# Patient Record
Sex: Female | Born: 1970 | Race: White | Hispanic: No | Marital: Married | Smoking: Never smoker
Health system: Southern US, Community
[De-identification: ages and names within clinical notes are randomized; demographics above are authoritative.]

## PROBLEM LIST (undated history)

## (undated) DIAGNOSIS — J45909 Unspecified asthma, uncomplicated: Secondary | ICD-10-CM

## (undated) HISTORY — DX: Unspecified asthma, uncomplicated: J45.909

## (undated) HISTORY — PX: EYE SURGERY: SHX253

## (undated) HISTORY — PX: ABDOMINAL HYSTERECTOMY: SHX81

## (undated) HISTORY — PX: BREAST SURGERY: SHX581

## (undated) HISTORY — PX: KNEE SURGERY: SHX244

## (undated) HISTORY — PX: BREAST BIOPSY: SHX20

---

## 1999-01-06 ENCOUNTER — Other Ambulatory Visit: Admission: RE | Admit: 1999-01-06 | Discharge: 1999-01-06 | Payer: Self-pay | Admitting: Obstetrics and Gynecology

## 2000-02-01 ENCOUNTER — Other Ambulatory Visit: Admission: RE | Admit: 2000-02-01 | Discharge: 2000-02-01 | Payer: Self-pay | Admitting: Obstetrics and Gynecology

## 2001-08-22 ENCOUNTER — Other Ambulatory Visit: Admission: RE | Admit: 2001-08-22 | Discharge: 2001-08-22 | Payer: Self-pay | Admitting: Obstetrics and Gynecology

## 2003-03-24 ENCOUNTER — Other Ambulatory Visit: Admission: RE | Admit: 2003-03-24 | Discharge: 2003-03-24 | Payer: Self-pay | Admitting: Obstetrics and Gynecology

## 2003-05-15 ENCOUNTER — Ambulatory Visit (HOSPITAL_COMMUNITY): Admission: RE | Admit: 2003-05-15 | Discharge: 2003-05-15 | Payer: Self-pay | Admitting: Obstetrics and Gynecology

## 2003-05-15 ENCOUNTER — Encounter (INDEPENDENT_AMBULATORY_CARE_PROVIDER_SITE_OTHER): Payer: Self-pay | Admitting: Specialist

## 2004-04-20 ENCOUNTER — Other Ambulatory Visit: Admission: RE | Admit: 2004-04-20 | Discharge: 2004-04-20 | Payer: Self-pay | Admitting: Obstetrics and Gynecology

## 2004-10-22 ENCOUNTER — Other Ambulatory Visit: Admission: RE | Admit: 2004-10-22 | Discharge: 2004-10-22 | Payer: Self-pay | Admitting: Obstetrics and Gynecology

## 2005-06-03 ENCOUNTER — Other Ambulatory Visit: Admission: RE | Admit: 2005-06-03 | Discharge: 2005-06-03 | Payer: Self-pay | Admitting: Obstetrics and Gynecology

## 2005-07-08 ENCOUNTER — Encounter: Admission: RE | Admit: 2005-07-08 | Discharge: 2005-07-08 | Payer: Self-pay | Admitting: Obstetrics and Gynecology

## 2005-07-13 ENCOUNTER — Encounter (INDEPENDENT_AMBULATORY_CARE_PROVIDER_SITE_OTHER): Payer: Self-pay | Admitting: Specialist

## 2005-07-13 ENCOUNTER — Ambulatory Visit (HOSPITAL_COMMUNITY): Admission: RE | Admit: 2005-07-13 | Discharge: 2005-07-14 | Payer: Self-pay | Admitting: Obstetrics and Gynecology

## 2005-08-05 ENCOUNTER — Encounter (INDEPENDENT_AMBULATORY_CARE_PROVIDER_SITE_OTHER): Payer: Self-pay | Admitting: Specialist

## 2005-08-05 ENCOUNTER — Encounter: Admission: RE | Admit: 2005-08-05 | Discharge: 2005-08-05 | Payer: Self-pay | Admitting: Obstetrics and Gynecology

## 2006-01-27 ENCOUNTER — Encounter: Admission: RE | Admit: 2006-01-27 | Discharge: 2006-01-27 | Payer: Self-pay | Admitting: Obstetrics and Gynecology

## 2006-10-01 ENCOUNTER — Encounter: Admission: RE | Admit: 2006-10-01 | Discharge: 2006-10-01 | Payer: Self-pay | Admitting: Sports Medicine

## 2007-03-08 ENCOUNTER — Ambulatory Visit (HOSPITAL_BASED_OUTPATIENT_CLINIC_OR_DEPARTMENT_OTHER): Admission: RE | Admit: 2007-03-08 | Discharge: 2007-03-08 | Payer: Self-pay | Admitting: Orthopedic Surgery

## 2008-08-21 ENCOUNTER — Ambulatory Visit (HOSPITAL_COMMUNITY): Admission: RE | Admit: 2008-08-21 | Discharge: 2008-08-22 | Payer: Self-pay | Admitting: Obstetrics and Gynecology

## 2010-01-20 ENCOUNTER — Encounter: Admission: RE | Admit: 2010-01-20 | Discharge: 2010-01-20 | Payer: Self-pay | Admitting: Obstetrics and Gynecology

## 2010-01-28 ENCOUNTER — Encounter: Admission: RE | Admit: 2010-01-28 | Discharge: 2010-01-28 | Payer: Self-pay | Admitting: Obstetrics and Gynecology

## 2010-05-16 ENCOUNTER — Encounter: Payer: Self-pay | Admitting: Sports Medicine

## 2010-08-04 LAB — CBC
HCT: 32.2 % — ABNORMAL LOW (ref 36.0–46.0)
HCT: 42 % (ref 36.0–46.0)
Hemoglobin: 11.1 g/dL — ABNORMAL LOW (ref 12.0–15.0)
Hemoglobin: 14.6 g/dL (ref 12.0–15.0)
MCHC: 34.4 g/dL (ref 30.0–36.0)
MCHC: 34.7 g/dL (ref 30.0–36.0)
MCV: 90.7 fL (ref 78.0–100.0)
MCV: 91.8 fL (ref 78.0–100.0)
Platelets: 216 10*3/uL (ref 150–400)
Platelets: 281 10*3/uL (ref 150–400)
RBC: 3.5 MIL/uL — ABNORMAL LOW (ref 3.87–5.11)
RBC: 4.63 MIL/uL (ref 3.87–5.11)
RDW: 13.5 % (ref 11.5–15.5)
RDW: 13.5 % (ref 11.5–15.5)
WBC: 7.5 10*3/uL (ref 4.0–10.5)
WBC: 9.1 10*3/uL (ref 4.0–10.5)

## 2010-09-07 NOTE — Op Note (Signed)
[name], [name] NO.:  [id number]   MEDICAL RECORD NO.:  [id number]          PATIENT TYPE:  AMB   LOCATION:  DSC                          FACILITY:  [facility]   PHYSICIAN:  Loreta Ave, M.D. DATE OF BIRTH:  Dec 02, 1970   DATE OF PROCEDURE:  03/08/2007  DATE OF DISCHARGE:                               OPERATIVE REPORT   PREOPERATIVE DIAGNOSIS:  Lateral meniscus tear left knee.   POSTOPERATIVE DIAGNOSIS:  Lateral meniscus tear left knee.   PROCEDURE:  Left knee exam under anesthesia, arthroscopy, partial  lateral meniscectomy.   SURGEON:  Loreta Ave, M.D.   ASSISTANT:  Genene Churn. Denton Meek.   ANESTHESIA:  Knee block with sedation.   SPECIMENS:  None.   CULTURES:  None.   COMPLICATIONS:  None.   DRESSING:  Soft compressive.   PROCEDURE:  The patient was brought to the operating room and placed on  operating table in the supine position.  After adequate anesthesia had  been obtained, the left knee was examined.  Full motion and good  stability.  Tourniquet and leg holder applied.  Prepped and draped in  the usual sterile fashion.  Three portals were created, one  superolateral, one each medial and lateral parapatellar.  Inflow  catheter induced.  Knee distended.  Arthroscope introduced.  Knee  inspected.  Intact articular cartilage throughout.  Good patellofemoral  tracking.  Cruciate ligament was intact.  Medial meniscus and medial  compartment normal.  Lateral meniscus radial tear, midportion extending  back into the posterior third.  __________ out to a stable rim.  About  half the posterior half removed until I got to stable tissue.  More than  half of the meniscus left all way around.  The entire knee was examined.  No other findings were appreciated.  Instruments and fluid were removed.  Portals of the knee were injected with Marcaine.  Portals were closed  with 4-0 nylon.  Sterile compressive dressing applied.  Anesthesia  reversed.   Brought to the recovery room.  Tolerated surgery well.  No  complications.      Loreta Ave, M.D.  Electronically Signed    DFM/MEDQ  D:  03/08/2007  T:  03/09/2007  Job:  [id number]

## 2010-09-07 NOTE — Op Note (Signed)
[name], [name]              ACCOUNT NO.:  [id number]   MEDICAL RECORD NO.:  [id number]          PATIENT TYPE:  AMB   LOCATION:  SDC                           FACILITY:  [facility]   PHYSICIAN:  Dineen Kid. Rana Snare, M.D.    DATE OF BIRTH:  [birth year]   DATE OF PROCEDURE:  08/21/2008  DATE OF DISCHARGE:                               OPERATIVE REPORT   PREOPERATIVE DIAGNOSES:  1. Symptomatic rectocele.  2. Pelvic pressure.  3. Vaginal vault prolapse.   POSTOPERATIVE DIAGNOSES:  1. Symptomatic rectocele.  2. Pelvic pressure.  3. Vaginal vault prolapse.   PROCEDURE:  Posterior colporrhaphy with sacrospinous ligament suspension  and perineorrhaphy.   SURGEON:  Dineen Kid. Rana Snare, MD   ASSISTANT:  Luvenia Redden, MD   ANESTHESIA:  General endotracheal.   INDICATIONS:  [name] is a 40 year old status post hysterectomy who  complains of worsening pelvic pressure mostly with standing.  She has  some difficulty with bowel movements.  She is concerned about the bulge  that comes from her rectocele to the introitus.  It has interfered with  her ability to function on a regular basis and enjoy social activities.  She does not have any symptoms of cystocele nor does she have urinary  incontinence.  She presents for definitive surgical intervention,  planned repair of the rectocele, and the vaginal prolapse of the apex of  the cuff.  The risks and benefits of the procedure were discussed at  length which include but not limited to risk of infection, bleeding,  damage to the bowel, bladder, ureters, ovaries, possibility that this  may not alleviate the pressure of the symptoms, possibility of  recurrence.  We have discussed different options including the use of  mesh.  At this time, she wished to proceed with the traditional repair  as outlined above.  All the risks and benefits were discussed.  Informed  consent was obtained.   DESCRIPTION OF PROCEDURE:  After adequate analgesia, the patient  was  placed in the dorsal lithotomy position.  She was sterilely prepped and  draped.  Bladder sterilely drained.  Two Allis clamps were placed on the  lateral sides of the perineal body at the edge of the introitus.  A  triangular flap was made across the skin on the perineal body.  The  vagina was then undermined with Metzenbaum scissors.  An incision was  made in the midline up to approximately 2-3 cm from the apex of the  vagina.  The posterior vaginal mucosa was reflected laterally reflecting  the rectal fascia off of the posterior vaginal mucosa.  After the  entirety of the rectocele had been visualized, no evidence of enterocele  had been noted.  The right sacrospinous process was identified.  The  Schutt ligature carrier was loaded with 0 PDS, suture was placed  approximately 2-3 fingerbreadths from the sacrospinous ligament towards  the sacrum through the sacrospinous ligament with good support noted.  A  Mayo needle was used to place a pulley stitch at the right apex of the  vaginal cuff using the same suture.  Posterior colporrhaphy was carried  out first with a pursestring fashion with 0 Monocryl suture reducing the  rectocele, then plicating the perirectal fascia in the midline using  figure-of-eights with 0 Monocryl suture, also passing the puborectal  fascia near the apex of the cuff to the uterosacral ligaments.  This was  performed until the good posterior wall support was noted.  The excess  vaginal mucosa was then excised.  The apex of vagina was grasped and  closure with 2-0 Monocryl suture was carried out from the apex to the  midportion of the vagina.  The sacrospinous suspension suture was then  secured with the right apex pulling snugly to the sacrospinous process.  The remainder portion of the vaginal mucosa was then closed using the 2-  0 Monocryl suture in a running locking fashion.  A suture of 0 Monocryl  suture was used to perform a perineorrhaphy plicating  the superficial  transverse perineal muscles in the midline.  The 2-0 Monocryl suture was  then used to close the subcuticular layer with good approximation and  good hemostasis noted.  Vaginal exam at this point revealed excellent  apical support at the sacrospinous process and excellent posterior  support at the rectocele.  Two fingertips of the operator were easily  inserted in the vagina.  At this point, the vaginal packing was placed  with estrogen-coated vaginal packing.  A Foley catheter was placed and  returned with clear yellow urine.  A small skin tag on the perineum was  easily removed and cauterized with Bovie cautery.  The patient was  stable on transfer to recovery room.  Sponge and instrument count was  normal x3.  Estimated blood loss was minimal.  The patient received 1 g  of cefotetan preoperatively.  The patient will be watched overnight as  an extended outpatient.      Dineen Kid Rana Snare, M.D.  Electronically Signed     DCL/MEDQ  D:  08/21/2008  T:  08/22/2008  Job:  [id number]

## 2010-09-10 NOTE — Discharge Summary (Signed)
[name], [name]              ACCOUNT NO.:  [id number]   MEDICAL RECORD NO.:  [id number]          PATIENT TYPE:  OIB   LOCATION:  9316                          FACILITY:  [facility]   PHYSICIAN:  Dineen Kid. Rana Snare, M.D.    DATE OF BIRTH:  15-Oct-1970   DATE OF ADMISSION:  08/21/2008  DATE OF DISCHARGE:  08/22/2008                               DISCHARGE SUMMARY   HISTORY OF PRESENT ILLNESS:  Ms. Inscore is a 40 year old status post  hysterectomy, worsening pelvic pressure mostly with standing, some  difficulty with bowel movements, and a bulge from her rectocele.  The  bulge is to her introitus and it is interfering with her  ability to  function on regular basis both socially and at her capacity at work. .  She does not have any significant cystocele or bladder issues and wishes  only repair of the vault prolapse and rectocele.   HOSPITAL COURSE:  The patient underwent a posterior colporrhaphy,  sacrospinous suspension of the vagina, and perineorrhaphy.  The  procedure was uncomplicated with minimal blood loss.  Her postoperative  care was unremarkable, and by postop day 1 she was ambulating without  difficulty, tolerating a regular diet.  The packing was removed.  Her  postop hemoglobin was 11.1.  She was able to urinate on her own, and  pain was well controlled.  The patient was discharged home and will  follow up in the office in 1 week.  She is sent home with a prescription  for oxycodone #30 to use as needed, told to return for increased pain,  fever, or bleeding.       Dineen Kid Rana Snare, M.D.  Electronically Signed     DCL/MEDQ  D:  09/10/2008  T:  09/10/2008  Job:  [id number]

## 2010-09-10 NOTE — H&P (Signed)
NAME:  [name], [name]                       ACCOUNT NO.:  [id number]   MEDICAL RECORD NO.:  [id number]                   PATIENT TYPE:  AMB   LOCATION:  SDC                                  FACILITY:  [facility]   PHYSICIAN:  Dineen Kid. Rana Snare, M.D.                 DATE OF BIRTH:  05-06-1970   DATE OF ADMISSION:  05/15/2003  DATE OF DISCHARGE:                                HISTORY & PHYSICAL   HISTORY OF PRESENT ILLNESS:  Ms. Toomey is a 40 year old G3, P3 with  abnormal uterine bleeding and menorrhagia.  She underwent a sonohistogram  which shows several large polyps within the uterus the largest measuring 12  mm in size and also a large cervical polyp.  She presents today for  hysteroscopy D&C and polypectomy.   PAST MEDICAL HISTORY:  Irritable bowel syndrome and a history of anemia.   PAST SURGICAL HISTORY:  Tonsillectomy and wisdom teeth extraction.  She has  had three vaginal deliveries.   MEDICATIONS:  FiberCon, vitamin E.   ALLERGIES:  She is allergic to ERYTHROMYCIN and AMOXICILLIN.   PHYSICAL EXAMINATION:  VITAL SIGNS:  Her blood pressure is 122/80.  HEART:  Regular rate and rhythm.  LUNGS:  Clear to auscultation bilaterally.  ABDOMEN:  Nondistended, nontender.  PELVIC:  Normal external genitalia, Bartholin, Skene, urethra.  Cervix does  have a large polyp coming from the endocervix, sonohistogram shows multiple  polyps measuring 12 mm in size, normal appearing ovaries.   IMPRESSION AND PLAN:  Cervical and uterine polyps, menorrhagia.  Recommend  hysteroscopy dilation and curettage for evaluation and removal of these.  Risks and benefits were discussed at length which include but not limited to  risk of infection, bleeding, damage to uterus, tubes, ovaries, bowel,  bladder, risk of recurrence or need for further surgery if these do appear  to be cancerous or precancerous.  She does give her informed consent.                                               Dineen Kid Rana Snare,  M.D.    DCL/MEDQ  D:  05/14/2003  T:  05/14/2003  Job:  [id number]

## 2010-09-10 NOTE — Op Note (Signed)
[name], [name]              ACCOUNT NO.:  [id number]   MEDICAL RECORD NO.:  [id number]          PATIENT TYPE:  OIB   LOCATION:  9312                          FACILITY:  [facility]   PHYSICIAN:  [name], M.D.    DATE OF BIRTH:  03-May-1970   DATE OF PROCEDURE:  07/14/2005  DATE OF DISCHARGE:                                 OPERATIVE REPORT   PREOPERATIVE DIAGNOSIS:  1.  Menometrorrhagia.  2.  Endometrial mass consistent with a polyp.  3.  History of recurrent polyps.  4.  History of fibroids.   POSTOPERATIVE DIAGNOSES:  1.  Menometrorrhagia.  2.  Endometrial mass consistent with a polyp.  3.  History of recurrent polyps.  4.  History of fibroids.   OPERATION/PROCEDURE:  Laparoscopic-assisted vaginal hysterectomy.   SURGEON:  [name], M.D.   ASSISTANT:  Juluis Mire, M.D.   INDICATIONS:  Mrs. [name] is a 40 year old, gravida 3, para 3, worsening  positive menorrhagia and irregular bleeding.  She had a D&C in the past for  fibroids and endometrial polyp.  She underwent a saline-infusion ultrasound  on February 20 which showed recurrence of polypoid mass.  Also intracavitary  mass suspicious for another fibroid.  At this time she desires definitive  surgical intervention and requests hysterectomy rather than hysteroscopy and  D&C.  Risks and benefits of the procedure were discussed at length and  informed consent was obtained.  See history and physical for further  details.   FINDINGS AT THE TIME OF SURGERY:  Normal-appearing liver, appendix, normal-  appearing uterus, ovaries and fallopian tubes.   DESCRIPTION OF PROCEDURE:  After adequate analgesia, the patient was placed  in the dorsal lithotomy position. She was sterilely prepped and draped and  the bladder sterilely drained.  Speculum was placed and tenaculum was placed  on the anterior lip of the cervix.  A 1 cm infraumbilical skin incision was  made.  A Veress needle was inserted.  The abdomen was  insufflated to  dullness to percussion.  An 11 mm trocar was inserted.  The laparoscope was  inserted and the above findings were noted.  A 5 mm trocar was inserted to  the left of the midline, two fingerbreadths above the pubic symphysis under  direct visualization.  A Gyrus grasping forceps was used to grasp, ligate  and dissect across the utero-ovarian ligaments bilaterally, down across the  round ligament to the inferior portion of the broad ligaments with the  ovaries, fallopian tube and round ligaments full and laterally to the uterus  with good hemostasis achieved.  A bladder flap was created raising the  ureterovesical junction, dissecting the bladder off the anterior surface of  the uterus.  The legs were repositioned, abdomen desufflated.  Weighted  speculum was placed.  A posterior colpotomy was performed.  Cervix was  circumscribed with the Bovie cautery.  LigaSure instrument was used to  ligate across the uterosacral ligaments.  Bladder pillars  bilaterally.  Inferior portion of the broad ligament dissecting with Mayo scissors.  The  uterus was then removed intact.  A suture  was of 0 Monocryl was placed on  each uterosacral ligament.  Posterior vagina was closed in the pursestring  fashion closing the peritoneum in a pursestring fashion and the posterior  vaginal mucosa was then closed with interrupted sutures of 0 Monocryl.  Anterior vaginal mucosa was closed in similar fashion using figure-of-eights  of 0 Monocryl suture in vertical fashion.  Good approximation and good  hemostasis achieved.  Foley catheter was placed in the bladder with good  return of clear yellow urine.  The legs were repositioned, abdomen  reinsufflated.  After irrigation with a  suction irrigator, peritoneal edges  were cauterized with bipolar cautery and after a copious amount of  irrigation, adequate hemostasis was assured.  The abdomen was then  desufflated. Trocars removed.  The  infraumbilical skin  incision was closed  with 0 Vicryl interrupted suture in the fascia, 3-0 Vicryl Rapide  subcuticular suture and the 5 mm site was closed with 3-0 Vicryl Rapide  subcuticular suture.  The incision was injected with 0.25% Marcaine.  A  total of 10 mL used.  The patient tolerated the procedure well, was stable  and transferred to the recovery room.  Sponge, needle and instrument counts  were normal x3.  Estimated blood loss was 250 mL.  The patient received 1 g  of Rocephin preoperatively.      Jennifer Kid Rana Padilla, M.D.  Electronically Signed     DCL/MEDQ  D:  07/13/2005  T:  07/14/2005  Job:  [id number]

## 2010-09-10 NOTE — H&P (Signed)
[name], [name] NO.:  [id number]   MEDICAL RECORD NO.:  [id number]           PATIENT TYPE:   LOCATION:                                 FACILITY:   PHYSICIAN:  Dineen Kid. Rana Snare, M.D.         DATE OF BIRTH:   DATE OF ADMISSION:  07/13/2005  DATE OF DISCHARGE:                                HISTORY & PHYSICAL   HISTORY OF PRESENT ILLNESS:  [name] is a 40 year old G3, P3 with  worsening problems with menorrhagia and irregular bleeding.  She has had a  D&C in the past for fibroids and also an endometrial polyp.  She underwent  saline infusion ultrasound on June 14, 2005 which does show recurrence  of a polypoid mass.  No intracavitary masses are seen otherwise and ovaries  are normal in appearance, but does have a history of previous fibroid which  was resected years ago.  At this time she desires more definitive surgical  intervention and because of history of recurrent polyps and history of  fibroids she desires a hysterectomy and presents for that as well.  She has  had a recent abnormal Pap smear, but it showed a low risk HPV subtype.   PAST MEDICAL HISTORY:  Significant for occasional anxiety.   MEDICATIONS:  1.  Valium as needed.  2.  Motrin as needed.  3.  She does take Vitamin B12.  4.  Multivitamin.   ALLERGIES:  1.  She has an allergy to ERYTHROMYCIN.  2.  AMOXICILLIN gives her diarrhea.   Her husband has had a vasectomy.   PHYSICAL EXAMINATION:  VITAL SIGNS:  Her blood pressure was 110/72, weight  162.  HEART:  Regular rate and rhythm.  LUNGS:  Clear to auscultation bilaterally.  ABDOMEN:  Nondistended, nontender.  PELVIC:  Uterus is anteverted, mobile, and nontender.  No adnexal masses are  palpable.  Small amount of vaginal bleeding is noted.  The cervix appears to  be normal.  Pap smear is performed.  Her hemoglobin is 13.2.   IMPRESSION/PLAN:  1.  Menometrorrhagia.  2.  Endometrial mass consistent with a polyp with a history  of recurrent      polyps.  3.  History of fibroids.  She desires definitive surgical intervention,      requests hysterectomy rather than D&C or removal of the polyp.      Discussed different options such as D&C, D&C plus endometrial ablation.      Also discussed the fact that her last Pap smear did show some atypical      glandular cells.  She did have a low risk HPV.  She was bleeding at the      time, but the Pap smear was performed and this most likely represents      endometrial glands.  She does have a low risk HPV based on recent Pap,      so we think that a likelihood of an adenocarcinoma of the cervix is also      very low based upon this.  Similar cancer of the uterus  is less likely.      I do feel like endometrial sampling, whether it be D&C or hysterectomy,      is warranted at this time.  At this time she is more inclined to lean      towards laparoscopic assisted vaginal hysterectomy.  She has had a D&C      in the past.  She has had recurrent bleeding since then and really wants      to proceed with definitive surgery, wants preservation of the ovaries as      well.  I discussed the risks and benefits and the recovery time at      length.  Risks include, but are not limited to, risk of infection,      bleeding, damage to bowel, bladder, ureters, risks associated with      anesthesia, risks associated with blood transfusion.  She does give her      informed consent and wished to proceed.  She does want to do it in a      timely fashion since her mother is having surgery in a month and a half      and she will need to be able to help her recover as well.      Dineen Kid Rana Snare, M.D.  Electronically Signed     DCL/MEDQ  D:  07/12/2005  T:  07/12/2005  Job:  [id number]

## 2010-09-10 NOTE — Discharge Summary (Signed)
[name], [name]              ACCOUNT NO.:  [id number]   MEDICAL RECORD NO.:  [id number]          PATIENT TYPE:  OIB   LOCATION:  9312                          FACILITY:  [facility]   PHYSICIAN:  Dineen Kid. Rana Snare, M.D.    DATE OF BIRTH:  [birth year]   DATE OF ADMISSION:  07/13/2005  DATE OF DISCHARGE:  07/14/2005                                 DISCHARGE SUMMARY   HISTORY OF PRESENT ILLNESS:  [name] is a 40 year old, G3, P3 with  worsening menorrhagia and irregular bleeding.  She has had a D&C in the past  for fibroids and endometrial polyps.  She underwent ultrafusion ultrasound  on February 20, showing recurrence of a polypoid mass.  She also fibroids.  At this time, she desires definitive surgical intervention requesting  hysterectomy rather than hysteroscopy.  Risks and benefits of the procedure  were discussed at length and informed consent had been obtained.  See  History and Physical for further details.   HOSPITAL COURSE:  The patient underwent a laparoscopic-assisted vaginal  hysterectomy.  Findings at the time of surgery were a normal-appearing  liver, appendix, ovaries and normal appearing uterus.  The procedure was  uncomplicated with estimated blood loss 250 mL.  The patient's hospital  course was complicated by postoperative nausea and responded by  discontinuing Dilaudid.  By postop day #1, she had a postop hemoglobin of  11.0.  She was able to tolerate a regular diet, ambulate without difficulty.  Bowel sounds were normoactive and the incisions were clean, dry and intact.  The patient was discharged to home.   FOLLOW UP:  Follow up in the office in 2-3 weeks.   DISCHARGE MEDICATIONS:  1.  Tylox #30.  2.  Motrin 600 mg, #30, return for increased pain, fever or bleeding.      Dineen Kid Rana Snare, M.D.  Electronically Signed     DCL/MEDQ  D:  09/11/2005  T:  09/12/2005  Job:  [id number]

## 2010-09-10 NOTE — Op Note (Signed)
NAME:  [name], [name]                       ACCOUNT NO.:  [id number]   MEDICAL RECORD NO.:  [id number]                   PATIENT TYPE:  AMB   LOCATION:  [facility]                                  FACILITY:  [facility]   PHYSICIAN:  Dineen Kid. Rana Snare, M.D.                 DATE OF BIRTH:  November 27, 1970   DATE OF PROCEDURE:  05/15/2003  DATE OF DISCHARGE:                                 OPERATIVE REPORT   PREOPERATIVE DIAGNOSES:  1. Menorrhagia.  2. Uterine polyps on sonohysterogram.  3. Large cervical polyp versus cervical fibroid.  4. Left labial cyst.   POSTOPERATIVE DIAGNOSES:  1. Menorrhagia.  2. Uterine polyps on sonohysterogram.  3. Large cervical polyp versus cervical fibroid.  4. Left labial cyst.   PROCEDURES:  1. Hysteroscopy, dilatation and curettage, with resection of endometrial     polyps and resection of cervical polyp versus fibroid.  2. Excision of left labial cyst.   SURGEON:  Dineen Kid. Rana Snare, M.D.   ANESTHESIA:  General by mask.   INDICATIONS:  Ms. Impson is a 40 year old G3, P3, with abnormal bleeding  and menorrhagia.  She underwent a sonohysterogram which shows several large  polyps within the uterus, the largest measuring 12 mm in size, also a very  large cervical polyp versus cervical fibroid, and she also has a left labial  cyst that she would like excised.  She presents today for surgical  evaluation and removal of these, and the risks and benefits were discussed  at length.  Informed consent was obtained.   Findings at the time of surgery were two moderately-sized endometrial polyps  from the posterior endometrium, otherwise normal-appearing endometrial  cavity and normal-appearing ostia.  A very large cervical polyp versus  fibroid approximately 2-3 cm in size, which was resected, and a left labial  inclusion cyst approximately 7-8 mm in size.   DESCRIPTION OF PROCEDURE:  After adequate analgesia, the patient was placed  in the dorsal lithotomy position.  She  was sterilely prepped and draped.  The bladder was sterilely drained.  A Graves speculum was placed.  A  tenaculum was placed on the anterior lip of the cervix.  Attempts to remove  the cervical fibroid versus polyp with polyp forceps were unsuccessful, so  the endocervix was dilated to #31 Pratt dilator.  Hysteroscope with  resectoscope was inserted and the above findings were noted.  The  resectoscope was used to remove the endometrial polyps.  This was followed  by sharp curettage, retrieving fragments of the small endometrial polyps,  and a second look with the hysteroscope in the endometrial cavity showed  normal endometrial wall, no residual polyps, and normal-appearing ostia  bilaterally.  The endocervical polyp was identified and resected with the  resectoscope and actually has more of an appearance of a cervical fibroid.  After resection, small fragments of the polyp were also similarly resected  and the cautery function of  the resectoscope was used to cauterize that area  of the cervix.  Careful examination of the cervix and endocervix revealed no  further evidence of endocervical polyps or fibroids, and the cavity was also  clear.  The sorbitol deficit at this point was 100 mL.  The patient  tolerated the procedure well, and the tenaculum was removed and was noted to  be hemostatic.  The left labial inclusion cyst was identified and grasped  with Allis clamp, was sharply excised with its cyst intact.  The remaining  portion of the incision was closed with a single horizontal mattress of 3-0  Vicryl Rapide with good approximation and good hemostasis achieved.  The  patient was transferred to the recovery room in stable condition.  The  sponge, needle, and instrument count was normal x3.  The estimated blood  loss less than 10 mL.  Sorbitol deficit 100 mL.  The patient received 400 mg  of Cipro IV preoperatively.   DISPOSITION:  The patient will be discharged home and will follow  up in the  office in two to three weeks.  Told to return for increased pain, fever, or  bleeding.  She was given 30 mg of IV Toradol in the operating room.                                               Dineen Kid Rana Snare, M.D.    DCL/MEDQ  D:  05/15/2003  T:  05/15/2003  Job:  [id number]

## 2010-12-22 ENCOUNTER — Other Ambulatory Visit: Payer: Self-pay | Admitting: Obstetrics and Gynecology

## 2010-12-22 DIAGNOSIS — Z1231 Encounter for screening mammogram for malignant neoplasm of breast: Secondary | ICD-10-CM

## 2011-01-31 ENCOUNTER — Ambulatory Visit
Admission: RE | Admit: 2011-01-31 | Discharge: 2011-01-31 | Disposition: A | Discharge status: Home / Self Care | Payer: BC Managed Care – PPO | Source: Ambulatory Visit | Attending: Obstetrics and Gynecology | Admitting: Obstetrics and Gynecology

## 2011-01-31 DIAGNOSIS — Z1231 Encounter for screening mammogram for malignant neoplasm of breast: Secondary | ICD-10-CM

## 2011-02-01 LAB — POCT HEMOGLOBIN-HEMACUE
Hemoglobin: 14.6
Operator id: 112821

## 2012-01-05 ENCOUNTER — Other Ambulatory Visit: Payer: Self-pay | Admitting: Obstetrics and Gynecology

## 2012-01-05 DIAGNOSIS — Z1231 Encounter for screening mammogram for malignant neoplasm of breast: Secondary | ICD-10-CM

## 2012-02-02 ENCOUNTER — Ambulatory Visit
Admission: RE | Admit: 2012-02-02 | Discharge: 2012-02-02 | Disposition: A | Discharge status: Home / Self Care | Payer: BC Managed Care – PPO | Source: Ambulatory Visit | Attending: Obstetrics and Gynecology | Admitting: Obstetrics and Gynecology

## 2012-02-02 DIAGNOSIS — Z1231 Encounter for screening mammogram for malignant neoplasm of breast: Secondary | ICD-10-CM

## 2012-02-07 ENCOUNTER — Other Ambulatory Visit: Payer: Self-pay | Admitting: Obstetrics and Gynecology

## 2012-02-07 DIAGNOSIS — R928 Other abnormal and inconclusive findings on diagnostic imaging of breast: Secondary | ICD-10-CM

## 2012-02-13 ENCOUNTER — Ambulatory Visit
Admission: RE | Admit: 2012-02-13 | Discharge: 2012-02-13 | Disposition: A | Discharge status: Home / Self Care | Payer: BC Managed Care – PPO | Source: Ambulatory Visit | Attending: Obstetrics and Gynecology | Admitting: Obstetrics and Gynecology

## 2012-02-13 DIAGNOSIS — R928 Other abnormal and inconclusive findings on diagnostic imaging of breast: Secondary | ICD-10-CM

## 2012-12-14 ENCOUNTER — Other Ambulatory Visit: Payer: Self-pay | Admitting: Podiatrist

## 2012-12-14 DIAGNOSIS — M79671 Pain in right foot: Secondary | ICD-10-CM

## 2012-12-20 ENCOUNTER — Inpatient Hospital Stay: Admission: RE | Admit: 2012-12-20 | Payer: BC Managed Care – PPO | Source: Ambulatory Visit

## 2012-12-25 ENCOUNTER — Other Ambulatory Visit: Payer: BC Managed Care – PPO

## 2013-01-07 ENCOUNTER — Other Ambulatory Visit: Payer: Self-pay

## 2013-01-07 DIAGNOSIS — Z1231 Encounter for screening mammogram for malignant neoplasm of breast: Secondary | ICD-10-CM

## 2013-02-05 ENCOUNTER — Ambulatory Visit
Admission: RE | Admit: 2013-02-05 | Discharge: 2013-02-05 | Disposition: A | Discharge status: Home / Self Care | Payer: BC Managed Care – PPO | Source: Ambulatory Visit

## 2013-02-05 DIAGNOSIS — Z1231 Encounter for screening mammogram for malignant neoplasm of breast: Secondary | ICD-10-CM

## 2014-01-16 ENCOUNTER — Other Ambulatory Visit: Payer: Self-pay

## 2014-01-16 DIAGNOSIS — Z1231 Encounter for screening mammogram for malignant neoplasm of breast: Secondary | ICD-10-CM

## 2014-01-31 ENCOUNTER — Other Ambulatory Visit: Payer: Self-pay | Admitting: Family Medicine

## 2014-01-31 ENCOUNTER — Other Ambulatory Visit: Payer: Self-pay

## 2014-01-31 ENCOUNTER — Ambulatory Visit (INDEPENDENT_AMBULATORY_CARE_PROVIDER_SITE_OTHER): Payer: BC Managed Care – PPO | Admitting: Family Medicine

## 2014-01-31 ENCOUNTER — Encounter: Payer: Self-pay | Admitting: Family Medicine

## 2014-01-31 VITALS — BP 130/72 | HR 76 | Temp 98.2°F | Resp 16 | Ht 63.0 in | Wt 161.0 lb

## 2014-01-31 DIAGNOSIS — R0789 Other chest pain: Secondary | ICD-10-CM | POA: Insufficient documentation

## 2014-01-31 DIAGNOSIS — N644 Mastodynia: Secondary | ICD-10-CM

## 2014-01-31 DIAGNOSIS — Z87898 Personal history of other specified conditions: Secondary | ICD-10-CM

## 2014-01-31 DIAGNOSIS — Z23 Encounter for immunization: Secondary | ICD-10-CM

## 2014-01-31 DIAGNOSIS — Z9189 Other specified personal risk factors, not elsewhere classified: Secondary | ICD-10-CM

## 2014-01-31 NOTE — Patient Instructions (Signed)
Continue advil as needed Mammogram to be set up

## 2014-01-31 NOTE — Addendum Note (Signed)
Addended by: Sheral Flow on: 01/31/2014 12:24 PM   Modules accepted: Orders

## 2014-01-31 NOTE — Assessment & Plan Note (Signed)
I think this is mostly chest wall pain I do not feel any significant lump or nodule in the breast that she is quite tender on the lateral edge of the breast. With her history I will go ahead and get a diagnostic mammogram and she's had multiple abnormal mammograms she will continue the anti-inflammatories for now

## 2014-01-31 NOTE — Progress Notes (Signed)
Patient ID: [name], female   DOB: 05/28/1970, 43 y.o.   MRN: [id number]   Subjective:    Patient ID: [name], female    DOB: 02-26-1971, 43 y.o.   MRN: [id number]  Patient presents for Pain  patient here with right breast pain and pain in her axilla. She's very concerned as she is history of multiple abnormal mammogram she's had benign cyst and abnormal dense tissue but no diagnosis of breast cancer. Her last mammogram was in October 2014 which was normal at that time but this is the only normal mammogram she's had out of the past 7 years. She's unsure of the pain she's been having for the past week may be related to her exercise routine. She wants however she swings her right arm and she has known osteoarthritis versus she's not sure she may have pulled something. She has been taking Advil which gives some relief. She denies any tingling or numbness in the hand    Review Of Systems:  GEN- denies fatigue, fever, weight loss,weakness, recent illness HEENT- denies eye drainage, change in vision, nasal discharge, CVS- denies chest pain, palpitations RESP- denies SOB, cough, wheeze MSK- denies joint pain,+ muscle aches, injury Neuro- denies headache, dizziness, syncope, seizure activity       Objective:    BP 130/72  Pulse 76  Temp(Src) 98.2 F (36.8 C) (Oral)  Resp 16  Ht 5\' 3"  (1.6 m)  Wt 161 lb (73.029 kg)  BMI 28.53 kg/m2 GEN- NAD, alert and oriented x3 Neck- Supple, FROM CVS- RRR, no murmur RESP-CTAB Breast- normal symmetry, no nipple inversion,no nipple drainage, no nodules or lumps felt, TTP 9 o oclock position Nodes- no axillary nodes MSK- Normal inspection of right shoulder, FROM, biceps in tact, TTP across right peck on lateral side of breast and beneath axilla          Assessment & Plan:      Problem List Items Addressed This Visit   Chest wall pain - Primary      Note: This dictation was prepared with Dragon dictation along with smaller  phrase technology. Any transcriptional errors that result from this process are unintentional.

## 2014-02-07 ENCOUNTER — Other Ambulatory Visit: Payer: BC Managed Care – PPO

## 2014-02-13 ENCOUNTER — Ambulatory Visit
Admission: RE | Admit: 2014-02-13 | Discharge: 2014-02-13 | Disposition: A | Discharge status: Home / Self Care | Payer: BC Managed Care – PPO | Source: Ambulatory Visit | Attending: Family Medicine | Admitting: Family Medicine

## 2014-02-13 ENCOUNTER — Ambulatory Visit: Payer: BC Managed Care – PPO

## 2014-02-13 ENCOUNTER — Other Ambulatory Visit: Payer: Self-pay | Admitting: Family Medicine

## 2014-02-13 DIAGNOSIS — N644 Mastodynia: Secondary | ICD-10-CM

## 2014-02-13 DIAGNOSIS — R0789 Other chest pain: Secondary | ICD-10-CM

## 2014-12-08 ENCOUNTER — Other Ambulatory Visit: Payer: Self-pay | Admitting: Ophthalmology

## 2015-01-13 ENCOUNTER — Other Ambulatory Visit: Payer: Self-pay

## 2015-01-13 DIAGNOSIS — Z1231 Encounter for screening mammogram for malignant neoplasm of breast: Secondary | ICD-10-CM

## 2015-02-17 ENCOUNTER — Ambulatory Visit
Admission: RE | Admit: 2015-02-17 | Discharge: 2015-02-17 | Disposition: A | Discharge status: Home / Self Care | Payer: BLUE CROSS/BLUE SHIELD | Source: Ambulatory Visit

## 2015-02-17 DIAGNOSIS — Z1231 Encounter for screening mammogram for malignant neoplasm of breast: Secondary | ICD-10-CM

## 2015-02-19 ENCOUNTER — Other Ambulatory Visit: Payer: Self-pay | Admitting: Obstetrics and Gynecology

## 2015-02-19 DIAGNOSIS — R928 Other abnormal and inconclusive findings on diagnostic imaging of breast: Secondary | ICD-10-CM

## 2015-02-25 ENCOUNTER — Ambulatory Visit
Admission: RE | Admit: 2015-02-25 | Discharge: 2015-02-25 | Disposition: A | Discharge status: Home / Self Care | Payer: BLUE CROSS/BLUE SHIELD | Source: Ambulatory Visit | Attending: Obstetrics and Gynecology | Admitting: Obstetrics and Gynecology

## 2015-02-25 DIAGNOSIS — R928 Other abnormal and inconclusive findings on diagnostic imaging of breast: Secondary | ICD-10-CM

## 2016-02-03 ENCOUNTER — Other Ambulatory Visit: Payer: Self-pay | Admitting: Obstetrics and Gynecology

## 2016-02-03 DIAGNOSIS — Z1231 Encounter for screening mammogram for malignant neoplasm of breast: Secondary | ICD-10-CM

## 2016-03-01 ENCOUNTER — Ambulatory Visit
Admission: RE | Admit: 2016-03-01 | Discharge: 2016-03-01 | Disposition: A | Discharge status: Home / Self Care | Payer: BLUE CROSS/BLUE SHIELD | Source: Ambulatory Visit | Attending: Obstetrics and Gynecology | Admitting: Obstetrics and Gynecology

## 2016-03-01 DIAGNOSIS — Z1231 Encounter for screening mammogram for malignant neoplasm of breast: Secondary | ICD-10-CM

## 2016-03-04 ENCOUNTER — Ambulatory Visit (INDEPENDENT_AMBULATORY_CARE_PROVIDER_SITE_OTHER): Payer: BLUE CROSS/BLUE SHIELD

## 2016-03-04 DIAGNOSIS — Z23 Encounter for immunization: Secondary | ICD-10-CM

## 2016-03-15 ENCOUNTER — Ambulatory Visit (INDEPENDENT_AMBULATORY_CARE_PROVIDER_SITE_OTHER): Payer: BLUE CROSS/BLUE SHIELD | Admitting: Family Medicine

## 2016-03-15 ENCOUNTER — Encounter: Payer: Self-pay | Admitting: Family Medicine

## 2016-03-15 VITALS — BP 144/92 | HR 84 | Temp 98.7°F | Resp 14 | Wt 153.0 lb

## 2016-03-15 DIAGNOSIS — J4521 Mild intermittent asthma with (acute) exacerbation: Secondary | ICD-10-CM

## 2016-03-15 MED ORDER — AZITHROMYCIN 250 MG PO TABS: ORAL_TABLET | ORAL | 0 refills | Status: DC

## 2016-03-15 MED ORDER — PREDNISONE 20 MG PO TABS: ORAL_TABLET | ORAL | 0 refills | Status: DC

## 2016-03-15 MED ORDER — ALBUTEROL SULFATE HFA 108 (90 BASE) MCG/ACT IN AERS: 2.0000 | INHALATION_SPRAY | Freq: Four times a day (QID) | RESPIRATORY_TRACT | 0 refills | Status: DC | PRN

## 2016-03-15 NOTE — Progress Notes (Signed)
   Subjective:    Patient ID: [name], female    DOB: Sep 14, 1970, 45 y.o.   MRN: [id number]  HPI Husband was recently diagnosed with a viral upper respiratory infection. Saturday the patient developed ear pain and head congestion. By Sunday the patient developed a severe cough. Now she is unable to stop coughing. She also reports chest tightness and shortness of breath. She has a history of asthma as a child but has not had an attack in several years. On examination today she has diminished breath sounds bilaterally and faint for wheezing but otherwise her exam today is completely normal No past medical history on file. No past surgical history on file. Current Outpatient Prescriptions on File Prior to Visit  Medication Sig Dispense Refill  . omeprazole (PRILOSEC) 20 MG capsule Take 20 mg by mouth daily.    . polycarbophil (FIBERCON) 625 MG tablet Take 1,250 mg by mouth daily.    . Probiotic Product (ALIGN) 4 MG CAPS Take by mouth.     No current facility-administered medications on file prior to visit.    No Known Allergies Social History   Social History  . Marital status: Married    Spouse name: N/A  . Number of children: N/A  . Years of education: N/A   Occupational History  . Not on file.   Social History Main Topics  . Smoking status: Never Smoker  . Smokeless tobacco: Never Used  . Alcohol use No  . Drug use: No  . Sexual activity: Yes   Other Topics Concern  . Not on file   Social History Narrative  . No narrative on file      Review of Systems  All other systems reviewed and are negative.      Objective:   Physical Exam  Constitutional: She appears well-developed and well-nourished.  HENT:  Right Ear: External ear normal.  Left Ear: External ear normal.  Nose: Nose normal.  Mouth/Throat: Oropharynx is clear and moist. No oropharyngeal exudate.  Eyes: Conjunctivae are normal.  Neck: Neck supple.  Cardiovascular: Normal rate, regular rhythm  and normal heart sounds.   Pulmonary/Chest: Effort normal. She has wheezes.  Abdominal: Soft. Bowel sounds are normal.  Lymphadenopathy:    She has no cervical adenopathy.  Vitals reviewed.         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 I recommended a prednisone taper pack for wheezing. I recommended albuterol 2 puffs inhaled every 6 hours as needed for wheezing. Because of going into things given I did give her a prescription for an antibiotic but she is not to get the antibiotic and less she develops high fevers, purulent sputum, and a worsening condition. I explained this in detail and she will not get the antibiotic, she develops those symptoms. Otherwise I anticipate his symptoms should gradually improve over the next 5 days

## 2016-03-24 ENCOUNTER — Telehealth: Payer: Self-pay | Admitting: Family Medicine

## 2016-03-24 DIAGNOSIS — J4521 Mild intermittent asthma with (acute) exacerbation: Secondary | ICD-10-CM

## 2016-03-24 MED ORDER — DOXYCYCLINE HYCLATE 100 MG PO TABS: 100.0000 mg | ORAL_TABLET | Freq: Two times a day (BID) | ORAL | 0 refills | Status: DC

## 2016-03-24 MED ORDER — ALBUTEROL SULFATE HFA 108 (90 BASE) MCG/ACT IN AERS: 2.0000 | INHALATION_SPRAY | Freq: Four times a day (QID) | RESPIRATORY_TRACT | 1 refills | Status: DC | PRN

## 2016-03-24 NOTE — Telephone Encounter (Signed)
Patient aware of providers recommendations and med sent to requested pharmacy

## 2016-03-24 NOTE — Telephone Encounter (Signed)
Pt called and states that she finished the antibx 03/22/16 as she developed green sputum on 03/18/16 and started it at that point, she has finished prednisone, tussinex and the inhaler but is still having really bad cough and chest congestion that is much worse at night.  Pt states she is going on a cruise next Saturday and would like maybe another antibx and inhaler. Please advise. I did advise her to get some mucinex for thechest congestion.

## 2016-03-24 NOTE — Telephone Encounter (Signed)
Doxycycline 100 mg 1 by mouth twice a day 10 days #20+0 Can send refill on the albuterol inhaler.

## 2016-06-07 ENCOUNTER — Encounter: Payer: Self-pay | Admitting: Family Medicine

## 2016-06-07 ENCOUNTER — Ambulatory Visit (INDEPENDENT_AMBULATORY_CARE_PROVIDER_SITE_OTHER): Payer: 59 | Admitting: Family Medicine

## 2016-06-07 VITALS — BP 136/78 | HR 92 | Temp 99.2°F | Resp 14 | Ht 63.0 in | Wt 142.0 lb

## 2016-06-07 DIAGNOSIS — J029 Acute pharyngitis, unspecified: Secondary | ICD-10-CM

## 2016-06-07 LAB — STREP GROUP A AG, W/REFLEX TO CULT: STREGTOCOCCUS GROUP A AG SCREEN: NOT DETECTED

## 2016-06-07 MED ORDER — CEFDINIR 300 MG PO CAPS: 300.0000 mg | ORAL_CAPSULE | Freq: Two times a day (BID) | ORAL | 0 refills | Status: DC

## 2016-06-07 MED ORDER — FIRST-DUKES MOUTHWASH MT SUSP: OROMUCOSAL | 0 refills | Status: DC

## 2016-06-07 NOTE — Progress Notes (Signed)
   Subjective:    Patient ID: [name], female    DOB: May 23, 1970, 46 y.o.   MRN: [id number]  Patient presents for Illness (x2 days- neck pain, ear pressure, sore throat) Patient here with sore throat neck pain here pain for the past 2 days. On Saturday she has to begin having significant copious amount of pus draining from her right eye. She was seen by her ophthalmologist who found a foreign body of glass in her eye. She still has significant drainage though the swelling is now improved. She and her husband were trying to keep her eye clear of the discharge. She was given drops for her eye yesterday after she had her urgent surgery. Overlapping that time is when her symptoms with the sore throat started. She has not had any cough or congestion she has had mild low-grade fever. She is unable to eat without significant pain.    Review Of Systems: per above   GEN- denies fatigue,+ fever, weight loss,weakness, recent illness HEENT- +eye drainage, change in vision, nasal discharge, CVS- denies chest pain, palpitations RESP- denies SOB, cough, wheeze ABD- denies N/V, change in stools, abd pain GU- denies dysuria, hematuria, dribbling, incontinence MSK- denies joint pain, muscle aches, injury Neuro- denies headache, dizziness, syncope, seizure activity       Objective:    BP 136/78   Pulse 92   Temp 99.2 F (37.3 C) (Oral)   Resp 14   Ht 5\' 3"  (1.6 m)   Wt 142 lb (64.4 kg)   SpO2 99%   BMI 25.15 kg/m   GEN- NAD, alert and oriented x3 HEENT- PERRL, EOMI, non injected sclera, pink conjunctiva,mild swelling beneath right eye  MMM, oropharynx + injection , mild exudate, TM clear bilat no effusion, no  maxillary sinus tenderness, nares clear Neck- Supple, + cervical  LAD CVS- RRR, no murmur RESP-CTAB Pulses- Radial 2+          Assessment & Plan:      Problem List Items Addressed This Visit    None    Visit Diagnoses    Acute pharyngitis, unspecified etiology    -   Primary   Rapid strep, neg, but based on exam recent infection in eye, unsure if tranferred to other mucous membranes, will treat omnicef BID, culture sent, [facility] magic mouthwash   Relevant Orders   STREP GROUP A AG, W/REFLEX TO CULT (Completed)      Note: This dictation was prepared with Dragon dictation along with smaller phrase technology. Any transcriptional errors that result from this process are unintentional.

## 2016-06-07 NOTE — Patient Instructions (Signed)
Use magic mouthwash Take antibiotics F/U as needed

## 2016-06-09 ENCOUNTER — Telehealth: Payer: Self-pay | Admitting: *Deleted

## 2016-06-09 LAB — CULTURE, GROUP A STREP

## 2016-06-09 NOTE — Telephone Encounter (Signed)
Complete the antibiotics She can take OTC cough medicine robitussin She also has the magic mouthwash, make sure this is not making it worst with burning in chest.

## 2016-06-09 NOTE — Telephone Encounter (Signed)
Received call from patient.   States that she has begun ABTx for ear pain and pharyngitis.   States that she is having more burning in chest now with cough.   MD please advise.

## 2016-06-09 NOTE — Telephone Encounter (Signed)
Call placed to patient.   States that she was unable to pick up prescription. Call placed to pharmacy and NO given for pharmacy formulation of magic mouthwash.   Advised to use OTC cough medications. Advised if cough is not improved by Monday to call back for re-check.

## 2017-02-08 ENCOUNTER — Other Ambulatory Visit: Payer: Self-pay | Admitting: Obstetrics and Gynecology

## 2017-02-08 DIAGNOSIS — Z1231 Encounter for screening mammogram for malignant neoplasm of breast: Secondary | ICD-10-CM

## 2017-03-02 ENCOUNTER — Ambulatory Visit
Admission: RE | Admit: 2017-03-02 | Discharge: 2017-03-02 | Disposition: A | Discharge status: Home / Self Care | Payer: 59 | Source: Ambulatory Visit | Attending: Obstetrics and Gynecology | Admitting: Obstetrics and Gynecology

## 2017-03-02 DIAGNOSIS — Z1231 Encounter for screening mammogram for malignant neoplasm of breast: Secondary | ICD-10-CM

## 2017-03-28 ENCOUNTER — Ambulatory Visit (INDEPENDENT_AMBULATORY_CARE_PROVIDER_SITE_OTHER): Payer: 59

## 2017-03-28 DIAGNOSIS — Z23 Encounter for immunization: Secondary | ICD-10-CM

## 2017-03-28 NOTE — Progress Notes (Signed)
Patient was in office today for flu vaccine.Patient received vaccine in right deltoid.Patient tolerate well

## 2017-07-19 ENCOUNTER — Other Ambulatory Visit: Payer: Self-pay

## 2017-07-19 ENCOUNTER — Other Ambulatory Visit: Payer: Self-pay | Admitting: Family Medicine

## 2017-07-19 ENCOUNTER — Encounter: Payer: Self-pay | Admitting: Family Medicine

## 2017-07-19 ENCOUNTER — Ambulatory Visit (INDEPENDENT_AMBULATORY_CARE_PROVIDER_SITE_OTHER): Payer: 59 | Admitting: Family Medicine

## 2017-07-19 VITALS — BP 120/80 | Temp 98.1°F | Ht 63.0 in | Wt 134.0 lb

## 2017-07-19 DIAGNOSIS — J4521 Mild intermittent asthma with (acute) exacerbation: Secondary | ICD-10-CM

## 2017-07-19 DIAGNOSIS — J209 Acute bronchitis, unspecified: Secondary | ICD-10-CM

## 2017-07-19 MED ORDER — GUAIFENESIN-CODEINE 100-10 MG/5ML PO SYRP: 5.0000 mL | ORAL_SOLUTION | Freq: Three times a day (TID) | ORAL | 0 refills | Status: DC | PRN

## 2017-07-19 MED ORDER — PREDNISONE 20 MG PO TABS: 40.0000 mg | ORAL_TABLET | Freq: Every day | ORAL | 0 refills | Status: AC

## 2017-07-19 MED ORDER — ALBUTEROL SULFATE HFA 108 (90 BASE) MCG/ACT IN AERS: 2.0000 | INHALATION_SPRAY | Freq: Four times a day (QID) | RESPIRATORY_TRACT | 1 refills | Status: DC | PRN

## 2017-07-19 MED ORDER — BENZONATATE 100 MG PO CAPS: 100.0000 mg | ORAL_CAPSULE | Freq: Three times a day (TID) | ORAL | 0 refills | Status: DC | PRN

## 2017-07-19 NOTE — Patient Instructions (Signed)

## 2017-07-19 NOTE — Progress Notes (Signed)
Patient ID: [name], female    DOB: 01/15/1971, 47 y.o.   MRN: [id number]  PCP: Alycia Rossetti, MD  Chief Complaint  Patient presents with  . Cough    Patient has c/o cough, chest congestion. Onset 2 weeks ago.    Subjective:   [name] is a 47 y.o. female, with PMH of reported mild asthma, presents to eval of cough x 2 weeks that began with URI sx, including ear pain, sore throat, and nasal congestion.  Cough is productive with green sputum over the past 3-4 days.  Yesterday her upper central chest and neck began burning, felt scratchy, and was very irritating with worsening cough, worse at night and first thing in the morning, and her voice became raspy and she has intermittent chills.  No relief with taking left over antibiotics, dayquil and nyquil.  She has not tried her albuterol inhaler, she believes it is expired.  Feels a little wheezy and tight with coughing fits.  She describes upper airway and throat burning, but denies any chest pain and pain with deep inspiration.  No exertional dyspnea, LE edema, orthopnea, PND, palpitations, fever, sweats, weightloss, rash, fatigue.  She had pneumonia in the past, but this does not feel like that.   Family members are ill with GI virus.     Prior to Admission medications   Medication Sig Start Date End Date Taking? Authorizing Provider  omeprazole (PRILOSEC) 20 MG capsule Take 20 mg by mouth daily.   Yes [provider]  polycarbophil (FIBERCON) 625 MG tablet Take 1,250 mg by mouth daily.   Yes [provider]  Probiotic Product (ALIGN) 4 MG CAPS Take by mouth.   Yes [provider]  albuterol (PROVENTIL HFA;VENTOLIN HFA) 108 (90 Base) MCG/ACT inhaler Inhale 2 puffs into the lungs every 6 (six) hours as needed for wheezing or shortness of breath. 07/19/17   Delsa Grana, PA-C  benzonatate (TESSALON) 100 MG capsule Take 1 capsule (100 mg total) by mouth 3 (three) times daily as needed for  cough. 07/19/17   Delsa Grana, PA-C  Diphenhyd-Hydrocort-Nystatin (FIRST-DUKES MOUTHWASH) SUSP Gargle and swallow 32ml four times a day as needed for sore throat for 2 weeks Patient not taking: Reported on 07/19/2017 06/07/16   Alycia Rossetti, MD  guaiFENesin-codeine [facility]) 100-10 MG/5ML syrup Take 5 mLs by mouth 3 (three) times daily as needed for cough. 07/19/17   Delsa Grana, PA-C  predniSONE (DELTASONE) 20 MG tablet Take 2 tablets (40 mg total) by mouth daily for 5 days. 07/19/17 07/24/17  Delsa Grana, PA-C     Allergies  Allergen Reactions  . Amoxicillin     IBS  . Erythromycin     IBS      Patient Active Problem List   Diagnosis Date Noted  . Breast pain 01/31/2014  . History of abnormal mammogram 01/31/2014     History reviewed. No pertinent family history.   Social History   Socioeconomic History  . Marital status: Married    Spouse name: Not on file  . Number of children: Not on file  . Years of education: Not on file  . Highest education level: Not on file  Occupational History  . Not on file  Social Needs  . Financial resource strain: Not on file  . Food insecurity:    Worry: Not on file    Inability: Not on file  . Transportation needs:    Medical: Not on file  Non-medical: Not on file  Tobacco Use  . Smoking status: Never Smoker  . Smokeless tobacco: Never Used  Substance and Sexual Activity  . Alcohol use: No  . Drug use: No  . Sexual activity: Yes  Lifestyle  . Physical activity:    Days per week: Not on file    Minutes per session: Not on file  . Stress: Not on file  Relationships  . Social connections:    Talks on phone: Not on file    Gets together: Not on file    Attends religious service: Not on file    Active member of club or organization: Not on file    Attends meetings of clubs or organizations: Not on file    Relationship status: Not on file  . Intimate partner violence:    Fear of current or ex partner: Not on file      Emotionally abused: Not on file    Physically abused: Not on file    Forced sexual activity: Not on file  Other Topics Concern  . Not on file  Social History Narrative  . Not on file    Review of Systems  Constitutional: Negative for activity change, appetite change, diaphoresis, fatigue, fever and unexpected weight change.  Eyes: Negative.  Negative for pain and redness.  Respiratory: Negative for apnea, choking and stridor.   Cardiovascular: Negative for palpitations and leg swelling.  Gastrointestinal: Positive for vomiting. Negative for abdominal pain, diarrhea and nausea.  Endocrine: Negative.   Genitourinary: Negative.   Musculoskeletal: Negative.  Negative for arthralgias and myalgias.  Skin: Negative.   Allergic/Immunologic: Negative.   Neurological: Negative for syncope, weakness, light-headedness and headaches.  Psychiatric/Behavioral: Negative.        Objective:   Physical Exam  Constitutional: She is oriented to person, place, and time. She appears well-developed and well-nourished.  Non-toxic appearance. No distress.  HENT:  Head: Normocephalic and atraumatic.  Right Ear: External ear normal.  Left Ear: External ear normal.  Nose: Nose normal.  Mouth/Throat: Uvula is midline, oropharynx is clear and moist and mucous membranes are normal. No oropharyngeal exudate.  Eyes: Pupils are equal, round, and reactive to light. Conjunctivae, EOM and lids are normal.  Neck: Normal range of motion and phonation normal. Neck supple. No tracheal deviation present.  Cardiovascular: Normal rate, regular rhythm, normal heart sounds, intact distal pulses and normal pulses. Exam reveals no gallop and no friction rub.  No murmur heard. Pulses:      Radial pulses are 2+ on the right side, and 2+ on the left side.       Posterior tibial pulses are 2+ on the right side, and 2+ on the left side.  Pulmonary/Chest: Effort normal. No stridor. No respiratory distress. She has no  rhonchi. She has no rales. She exhibits no tenderness.  Frequent cough, scattered intermittent rhonchi, faint exp wheeze  Abdominal: Soft. Normal appearance and bowel sounds are normal. She exhibits no distension. There is no tenderness. There is no rebound and no guarding.  Musculoskeletal: Normal range of motion. She exhibits no edema or deformity.  Lymphadenopathy:    She has no cervical adenopathy.  Neurological: She is alert and oriented to person, place, and time. She exhibits normal muscle tone. Gait normal.  Skin: Skin is warm, dry and intact. Capillary refill takes less than 2 seconds. No rash noted. She is not diaphoretic. No pallor.  Psychiatric: She has a normal mood and affect. Her speech is normal and behavior is  normal.            Assessment & Plan:     ICD-10-CM   1. Acute bronchitis, unspecified organism J20.9 benzonatate (TESSALON) 100 MG capsule    guaiFENesin-codeine (ROBITUSSIN AC) 100-10 MG/5ML syrup    albuterol (PROVENTIL HFA;VENTOLIN HFA) 108 (90 Base) MCG/ACT inhaler    predniSONE (DELTASONE) 20 MG tablet  2. Mild intermittent asthmatic bronchitis with acute exacerbation J45.21 albuterol (PROVENTIL HFA;VENTOLIN HFA) 108 (90 Base) MCG/ACT inhaler    predniSONE (DELTASONE) 20 MG tablet    No imaging indicated, pt VSS, lung CTA A&P Follow up as needed  Delsa Grana, PA-C 07/19/2017 3:55 PM

## 2017-07-20 ENCOUNTER — Telehealth: Payer: Self-pay | Admitting: Family Medicine

## 2017-07-20 DIAGNOSIS — J4521 Mild intermittent asthma with (acute) exacerbation: Secondary | ICD-10-CM

## 2017-07-20 DIAGNOSIS — J209 Acute bronchitis, unspecified: Secondary | ICD-10-CM

## 2017-07-20 MED ORDER — HYDROCODONE-CHLORPHENIRAMINE 5-4 MG/5ML PO SOLN: 5.0000 mL | Freq: Two times a day (BID) | ORAL | 0 refills | Status: DC | PRN

## 2017-07-20 MED ORDER — HYDROCOD POLST-CPM POLST ER 10-8 MG/5ML PO SUER: ORAL | 0 refills | Status: DC

## 2017-07-20 NOTE — Telephone Encounter (Signed)
[name] called pharmacy to verify whether or not yesterdays cough medicine was picked up, and it was.  That medicine should be discontinued if ineffective. I did acquiesce and sent Tussionex to pharmacy on file.  Please ask patient to exchange codeine cough syrup for tussionex at pharmacy because they are both controlled substances and she should not be prescribed both at the same time.   The patients cough will likely continue for 1-2 weeks, and it is common for bronchitis sometimes to last up to 1-2 months.    Try other over the counter remedies, add mucinex.

## 2017-07-20 NOTE — Telephone Encounter (Signed)
Patient's husband called on behalf of the patient stating that patient cough is still fairly ruff. He stated that patient was prescribed Tessalon Perles yesterday but it is not helping with the cough. Patient would like to know is she can get Tussionex called in. Please advise?

## 2017-07-20 NOTE — Telephone Encounter (Signed)
Have sent another Rx to pharmacy, Ofilia Neas has contacted pharmacy and cancelled all other.  Pt notified

## 2017-07-20 NOTE — Telephone Encounter (Signed)
Spoke with [facility] and they stated that the medication that was sent into the pharmacy did not come up as  Tussionex. Pharmacy tech said that they do have Tussionex but prescription strength will need to be 10-5-8 in strength.

## 2017-07-20 NOTE — Addendum Note (Signed)
Addended by: Delsa Grana on: 07/20/2017 04:30 PM   Modules accepted: Orders

## 2017-07-21 ENCOUNTER — Telehealth: Payer: Self-pay

## 2017-07-21 MED ORDER — CEFDINIR 300 MG PO CAPS: 300.0000 mg | ORAL_CAPSULE | Freq: Two times a day (BID) | ORAL | 0 refills | Status: DC

## 2017-07-21 NOTE — Telephone Encounter (Signed)
She can try reducing dose to 20mg  once a day for 5 day, it helps with the bronchospasm, try taking with food  Send Omnicef 300mg  BID x 7 days,advise to try to hold off and not take for another 48 hours as bronchitis is typically viral  Continue the albuterol

## 2017-07-21 NOTE — Telephone Encounter (Signed)
Call placed to patient and patient made aware. Verbalized understanding.  

## 2017-07-21 NOTE — Telephone Encounter (Signed)
Patient called and states when she started taking prednisone she started to experience nausea, as well as abdominal pain. Patient took 2 pills Wednesday and 2 pills Thursday and has not taking any today.    Patient states she asked Leisa to prescribe an antibiotic, but she didn't feel that was necessary. Patient states she need an antibiotic called in [facility]

## 2017-07-27 DIAGNOSIS — Z6824 Body mass index (BMI) 24.0-24.9, adult: Secondary | ICD-10-CM | POA: Diagnosis not present

## 2017-07-27 DIAGNOSIS — Z01419 Encounter for gynecological examination (general) (routine) without abnormal findings: Secondary | ICD-10-CM | POA: Diagnosis not present

## 2017-07-31 DIAGNOSIS — D23122 Other benign neoplasm of skin of left lower eyelid, including canthus: Secondary | ICD-10-CM | POA: Diagnosis not present

## 2017-08-28 DIAGNOSIS — D23122 Other benign neoplasm of skin of left lower eyelid, including canthus: Secondary | ICD-10-CM | POA: Diagnosis not present

## 2017-10-09 DIAGNOSIS — R1032 Left lower quadrant pain: Secondary | ICD-10-CM | POA: Diagnosis not present

## 2017-10-09 DIAGNOSIS — N760 Acute vaginitis: Secondary | ICD-10-CM | POA: Diagnosis not present

## 2017-10-09 DIAGNOSIS — R82998 Other abnormal findings in urine: Secondary | ICD-10-CM | POA: Diagnosis not present

## 2017-10-09 DIAGNOSIS — N951 Menopausal and female climacteric states: Secondary | ICD-10-CM | POA: Diagnosis not present

## 2017-11-01 DIAGNOSIS — R1032 Left lower quadrant pain: Secondary | ICD-10-CM | POA: Diagnosis not present

## 2017-11-07 ENCOUNTER — Other Ambulatory Visit: Payer: Self-pay

## 2017-11-07 ENCOUNTER — Ambulatory Visit (HOSPITAL_COMMUNITY)
Admission: RE | Admit: 2017-11-07 | Discharge: 2017-11-07 | Disposition: A | Discharge status: Home / Self Care | Payer: 59 | Source: Ambulatory Visit | Attending: Family Medicine | Admitting: Family Medicine

## 2017-11-07 ENCOUNTER — Encounter: Payer: Self-pay | Admitting: Family Medicine

## 2017-11-07 ENCOUNTER — Ambulatory Visit: Payer: 59 | Admitting: Family Medicine

## 2017-11-07 ENCOUNTER — Encounter (HOSPITAL_COMMUNITY): Payer: Self-pay

## 2017-11-07 VITALS — BP 116/62 | HR 90 | Temp 98.5°F | Resp 14 | Ht 63.0 in | Wt 148.0 lb

## 2017-11-07 DIAGNOSIS — K529 Noninfective gastroenteritis and colitis, unspecified: Secondary | ICD-10-CM | POA: Diagnosis not present

## 2017-11-07 DIAGNOSIS — M545 Low back pain, unspecified: Secondary | ICD-10-CM

## 2017-11-07 DIAGNOSIS — R1032 Left lower quadrant pain: Secondary | ICD-10-CM

## 2017-11-07 DIAGNOSIS — R1084 Generalized abdominal pain: Secondary | ICD-10-CM | POA: Diagnosis not present

## 2017-11-07 LAB — CBC WITH DIFFERENTIAL/PLATELET
BASOS ABS: 61 {cells}/uL (ref 0–200)
BASOS PCT: 1 %
Eosinophils Absolute: 110 cells/uL (ref 15–500)
Eosinophils Relative: 1.8 %
HEMATOCRIT: 40 % (ref 35.0–45.0)
Hemoglobin: 13.8 g/dL (ref 11.7–15.5)
LYMPHS ABS: 1598 {cells}/uL (ref 850–3900)
MCH: 30.8 pg (ref 27.0–33.0)
MCHC: 34.5 g/dL (ref 32.0–36.0)
MCV: 89.3 fL (ref 80.0–100.0)
MPV: 10.2 fL (ref 7.5–12.5)
Monocytes Relative: 6.4 %
Neutro Abs: 3941 cells/uL (ref 1500–7800)
Neutrophils Relative %: 64.6 %
Platelets: 304 10*3/uL (ref 140–400)
RBC: 4.48 10*6/uL (ref 3.80–5.10)
RDW: 12 % (ref 11.0–15.0)
Total Lymphocyte: 26.2 %
WBC mixed population: 390 cells/uL (ref 200–950)
WBC: 6.1 10*3/uL (ref 3.8–10.8)

## 2017-11-07 LAB — COMPREHENSIVE METABOLIC PANEL
AG RATIO: 2.3 (calc) (ref 1.0–2.5)
ALT: 8 U/L (ref 6–29)
AST: 13 U/L (ref 10–35)
Albumin: 5.2 g/dL — ABNORMAL HIGH (ref 3.6–5.1)
Alkaline phosphatase (APISO): 35 U/L (ref 33–115)
BUN/Creatinine Ratio: 8 (calc) (ref 6–22)
BUN: 6 mg/dL — AB (ref 7–25)
CO2: 25 mmol/L (ref 20–32)
Calcium: 10.2 mg/dL (ref 8.6–10.2)
Chloride: 104 mmol/L (ref 98–110)
Creat: 0.74 mg/dL (ref 0.50–1.10)
Globulin: 2.3 g/dL (calc) (ref 1.9–3.7)
Glucose, Bld: 108 mg/dL — ABNORMAL HIGH (ref 65–99)
Potassium: 4.3 mmol/L (ref 3.5–5.3)
SODIUM: 138 mmol/L (ref 135–146)
TOTAL PROTEIN: 7.5 g/dL (ref 6.1–8.1)
Total Bilirubin: 0.5 mg/dL (ref 0.2–1.2)

## 2017-11-07 MED ORDER — CYCLOBENZAPRINE HCL 10 MG PO TABS: 10.0000 mg | ORAL_TABLET | Freq: Three times a day (TID) | ORAL | 0 refills | Status: DC | PRN

## 2017-11-07 MED ORDER — IOPAMIDOL (ISOVUE-300) INJECTION 61%
100.0000 mL | Freq: Once | INTRAVENOUS | Status: AC | PRN
  Administered 2017-11-07: 100 mL via INTRAVENOUS

## 2017-11-07 MED ORDER — IOPAMIDOL (ISOVUE-300) INJECTION 61%
INTRAVENOUS | Status: AC
  Filled 2017-11-07: qty 100

## 2017-11-07 MED ORDER — HYDROCODONE-ACETAMINOPHEN 5-325 MG PO TABS: 1.0000 | ORAL_TABLET | Freq: Four times a day (QID) | ORAL | 0 refills | Status: DC | PRN

## 2017-11-07 NOTE — Progress Notes (Signed)
Subjective:    Patient ID: [name], female    DOB: 05-28-1970, 47 y.o.   MRN: [id number]  Patient presents for ABD Pain (L sided abd pain- has IBS- has appt with GI at the end of the month) and Lower Back Pain (pain near spine)  Pt here with abdominal pain for past    Lower back pain since yesterday, had been carrying cooler and her purse, then helping move her dad who is currently sick in the hospital he has metastatic cancer.  After all the events of yesterday she got back in her car to go home and be noticed severe lower back pain.  No change in bowel or bladder denies any radiating symptoms.  She did use a heating pad and muscle relaxer yesterday which helped some.    IBS-she has had IBS since the age of 37 she was followed by GI for many years but is not seen recently.  Back in the spring she was having some upper respiratory symptoms.  She was prescribed prednisone but actually had abdominal pain which was severe after taking so she stopped the medication and her symptoms improved.  She would typically have multiple bowel movements a day some loose others not.  She denies any blood in the stool.  Since this spring she has had intermittent episodes of abdominal pain mostly in the right left lower quadrant.  She was seen by her GYN a couple times over the past few months.  When she had an ultrasound secondary to the pain she was told there is no ovarian pathology she is actually had a hysterectomy but she did have a large colon.  Advised her to try to empty her bowel so she use prunes and she was having good bowel movements but still has a intermittent pain.  Over the past month or so she has had increased pain in this area some days she feels good other days she will have the episodes.  Still no nausea vomiting no fever no recent watery stools.  She does have an appointment to see her gastroenterologist in 2 weeks Dr. Mikel Cella at [facility]/ Dr. Cristina Gong Hysterectomy- Dr. Corinna [facility]   Note he was treated for bacterial vaginosis by her GYN with metronidazole and Diflucan a month or so ago.   Last colonoscopy age  64 IBS since age 11                 Review Of Systems:  GEN- denies fatigue, fever, weight loss,weakness, recent illness HEENT- denies eye drainage, change in vision, nasal discharge, CVS- denies chest pain, palpitations RESP- denies SOB, cough, wheeze ABD- denies N/V, change in stools,+ abd pain GU- denies dysuria, hematuria, dribbling, incontinence +- denies joint pain, muscle aches, injury Neuro- denies headache, dizziness, syncope, seizure activity       Objective:    BP 116/62   Pulse 90   Temp 98.5 F (36.9 C) (Oral)   Resp 14   Ht 5\' 3"  (1.6 m)   Wt 148 lb (67.1 kg)   SpO2 99%   BMI 26.22 kg/m  GEN- NAD, alert and oriented x3 HEENT- PERRL, EOMI, non injected sclera, pink conjunctiva, MMM, oropharynx clear Neck- Supple, no LAD  CVS- RRR, no murmur RESP-CTAB ABD-Hypoactive ,soft,TTP LLQ, + guarding, no rebound, no mass,  Spine- TTP lumbar spine and paraspinals, mild spasm L> R, neg SLR, decreaed ROM spine  Neuro- antalgic gait, tone equal bilat LE, sensation in tact, motor equal bialt  EXT- No edema Pulses- Radial, DP- 2+        Assessment & Plan:      Problem List Items Addressed This Visit    None    Visit Diagnoses    Colitis    -  Primary   Concern for colitis, possible IBD, will obtain CT scan abd/pelvis, pending results, will call her GI for earlier appt, I think she will need colonoscopy as well   Relevant Orders   CBC with Differential/Platelet (Completed)   Comprehensive metabolic panel   CT Abdomen Pelvis W Contrast   LLQ pain       Relevant Orders   CBC with Differential/Platelet (Completed)   Comprehensive metabolic panel   CT Abdomen Pelvis W Contrast   Generalized abdominal pain       Lumbar back pain       MSK pain from yesterday, avoiding NSAID/PREDNISONE due to above, will  treat with flexeril, norco, heating pad    Relevant Medications   HYDROcodone-acetaminophen (NORCO) 5-325 MG tablet   cyclobenzaprine (FLEXERIL) 10 MG tablet      Note: This dictation was prepared with Dragon dictation along with smaller phrase technology. Any transcriptional errors that result from this process are unintentional.

## 2017-11-07 NOTE — Patient Instructions (Addendum)
CT scan to be done  

## 2017-11-08 ENCOUNTER — Other Ambulatory Visit: Payer: 59

## 2017-11-08 ENCOUNTER — Other Ambulatory Visit: Payer: Self-pay | Admitting: *Deleted

## 2017-11-08 ENCOUNTER — Telehealth: Payer: Self-pay | Admitting: *Deleted

## 2017-11-08 DIAGNOSIS — R1032 Left lower quadrant pain: Secondary | ICD-10-CM | POA: Diagnosis not present

## 2017-11-08 DIAGNOSIS — R1084 Generalized abdominal pain: Secondary | ICD-10-CM | POA: Diagnosis not present

## 2017-11-08 NOTE — Telephone Encounter (Signed)
Received call from patient.   Reports that she is continuing to have lower abd pain, but is now having some discomfort while urinating.   Advised to bring urine sample to office. Future orders placed.

## 2017-11-09 LAB — URINE CULTURE
MICRO NUMBER: 90846636
MICRO NUMBER:: 90846836
Result:: NO GROWTH
SPECIMEN QUALITY: ADEQUATE
SPECIMEN QUALITY:: ADEQUATE

## 2017-11-22 DIAGNOSIS — R1032 Left lower quadrant pain: Secondary | ICD-10-CM | POA: Diagnosis not present

## 2018-02-13 ENCOUNTER — Other Ambulatory Visit: Payer: Self-pay | Admitting: Obstetrics and Gynecology

## 2018-02-13 DIAGNOSIS — Z1231 Encounter for screening mammogram for malignant neoplasm of breast: Secondary | ICD-10-CM

## 2018-03-14 ENCOUNTER — Other Ambulatory Visit: Payer: Self-pay

## 2018-03-14 ENCOUNTER — Encounter: Payer: Self-pay | Admitting: Family Medicine

## 2018-03-14 ENCOUNTER — Ambulatory Visit (INDEPENDENT_AMBULATORY_CARE_PROVIDER_SITE_OTHER): Payer: 59 | Admitting: Family Medicine

## 2018-03-14 VITALS — BP 102/68 | HR 76 | Temp 98.3°F | Resp 14 | Ht 63.0 in | Wt 154.0 lb

## 2018-03-14 DIAGNOSIS — L608 Other nail disorders: Secondary | ICD-10-CM

## 2018-03-14 DIAGNOSIS — Z8249 Family history of ischemic heart disease and other diseases of the circulatory system: Secondary | ICD-10-CM | POA: Diagnosis not present

## 2018-03-14 DIAGNOSIS — M25542 Pain in joints of left hand: Secondary | ICD-10-CM | POA: Diagnosis not present

## 2018-03-14 DIAGNOSIS — M25541 Pain in joints of right hand: Secondary | ICD-10-CM

## 2018-03-14 DIAGNOSIS — Z23 Encounter for immunization: Secondary | ICD-10-CM | POA: Diagnosis not present

## 2018-03-14 DIAGNOSIS — Z13220 Encounter for screening for lipoid disorders: Secondary | ICD-10-CM

## 2018-03-14 DIAGNOSIS — L659 Nonscarring hair loss, unspecified: Secondary | ICD-10-CM | POA: Diagnosis not present

## 2018-03-14 NOTE — Patient Instructions (Signed)
Referral to cardiology We will call with lab results F/U pending results   

## 2018-03-14 NOTE — Progress Notes (Signed)
Subjective:    Patient ID: [name], female    DOB: April 23, 1971, 47 y.o.   MRN: [id number]  Patient presents for Hand Pain (arthritis pain in B hands) and Nail/ Hair Issue (cupping in nails and is concerned D/T family Hx)   Here with concern about her general health.  Her father passed away secondary to heart disease.  He actually had his first heart attack at the age of 74 had a second one at 70 he is also had 2 strokes.  She is concerned about heart issues at the research online which she notes that her hair was thinning her nails have ridges in them and are cupping over the past year.  She does not smoke she exercises eats very healthy.  She does take vitamin supplements including fiber.  She has some chronic joint pain which has been worsened over the past 3 months as well mostly in her hand she gets swelling and aching.  She denies any tingling or numbness in the fingertips.  She is actually had joint issues on and off since her teenage years.  Denies any SOB, has had occasional Chest discomfort but never thought it was cardiac as she can exercise without any difficulty  She does admit to a lot of stress past few months   Review Of Systems:  GEN- + fatigue, fever, weight loss,weakness, recent illness HEENT- denies eye drainage, change in vision, nasal discharge, CVS- denies chest pain, palpitations RESP- denies SOB, cough, wheeze ABD- denies N/V, change in stools, abd pain GU- denies dysuria, hematuria, dribbling, incontinence MSK- + joint pain, muscle aches, injury Neuro- denies headache, dizziness, syncope, seizure activity       Objective:    BP 102/68   Pulse 76   Temp 98.3 F (36.8 C) (Oral)   Resp 14   Ht 5\' 3"  (1.6 m)   Wt 154 lb (69.9 kg)   SpO2 99%   BMI 27.28 kg/m  GEN- NAD, alert and oriented x3 HEENT- PERRL, EOMI, non injected sclera, pink conjunctiva, MMM, oropharynx clear Neck- Supple, no thyromegaly CVS- RRR, no  murmur RESP-CTAB ABD-NABS,soft,NT,ND MSK- FROM upper and lower ext,  Neuro- normal grasp, strength 5/5 bilat, sensation grossly in tact, no joint effusions EXT- No edema Skin- vertical ridges in most of fingernails and on some toenails, brittle apperance, hair no blading areas, thinning Pulses- Radial, DP- 2+        Assessment & Plan:      Problem List Items Addressed This Visit    None    Visit Diagnoses    Family history of premature CAD    -  Primary   Referral to cardiology for risk stratification possible stress testing obtain fasting lipids/metabolic work up   Relevant Orders   CBC with Differential/Platelet (Completed)   Comprehensive metabolic panel (Completed)   Lipid panel (Completed)   Arthralgia of both hands       obtain autoimmune labs, next step xrays of hands/possible rhuematology   Relevant Orders   TSH (Completed)   Lipid panel (Completed)   ANA   Sedimentation Rate (Completed)   C-reactive protein (Completed)   Rheumatoid factor (Completed)   Hair thinning       Change in nail appearance       Could be nutritional deficiency such as iron or calcium/ thyroid possible, check labs   Relevant Orders   Iron, TIBC and Ferritin Panel (Completed)   Lipid screening       Relevant Orders  Lipid panel (Completed)   Need for immunization against influenza       Relevant Orders   Flu Vaccine QUAD 36+ mos IM (Completed)      Note: This dictation was prepared with Dragon dictation along with smaller phrase technology. Any transcriptional errors that result from this process are unintentional.

## 2018-03-18 LAB — COMPREHENSIVE METABOLIC PANEL
AG Ratio: 2 (calc) (ref 1.0–2.5)
ALKALINE PHOSPHATASE (APISO): 34 U/L (ref 33–115)
ALT: 8 U/L (ref 6–29)
AST: 14 U/L (ref 10–35)
Albumin: 4.4 g/dL (ref 3.6–5.1)
BUN: 9 mg/dL (ref 7–25)
CHLORIDE: 103 mmol/L (ref 98–110)
CO2: 28 mmol/L (ref 20–32)
CREATININE: 0.73 mg/dL (ref 0.50–1.10)
Calcium: 9.5 mg/dL (ref 8.6–10.2)
GLOBULIN: 2.2 g/dL (ref 1.9–3.7)
GLUCOSE: 83 mg/dL (ref 65–99)
POTASSIUM: 4.2 mmol/L (ref 3.5–5.3)
Sodium: 138 mmol/L (ref 135–146)
Total Bilirubin: 0.3 mg/dL (ref 0.2–1.2)
Total Protein: 6.6 g/dL (ref 6.1–8.1)

## 2018-03-18 LAB — ANA: ANA: POSITIVE — AB

## 2018-03-18 LAB — ANTI-NUCLEAR AB-TITER (ANA TITER)

## 2018-03-18 LAB — IRON,TIBC AND FERRITIN PANEL
%SAT: 19 % (calc) (ref 16–45)
Ferritin: 12 ng/mL — ABNORMAL LOW (ref 16–232)
IRON: 54 ug/dL (ref 40–190)
TIBC: 278 ug/dL (ref 250–450)

## 2018-03-18 LAB — LIPID PANEL
CHOL/HDL RATIO: 2.4 (calc) (ref <5.0)
Cholesterol: 179 mg/dL (ref <200)
HDL: 76 mg/dL (ref >50)
LDL Cholesterol (Calc): 87 mg/dL (calc)
Non-HDL Cholesterol (Calc): 103 mg/dL (calc) (ref <130)
Triglycerides: 72 mg/dL (ref <150)

## 2018-03-18 LAB — CBC WITH DIFFERENTIAL/PLATELET
BASOS PCT: 0.9 %
Basophils Absolute: 60 cells/uL (ref 0–200)
EOS ABS: 201 {cells}/uL (ref 15–500)
Eosinophils Relative: 3 %
HCT: 37.4 % (ref 35.0–45.0)
Hemoglobin: 12.5 g/dL (ref 11.7–15.5)
Lymphs Abs: 2184 cells/uL (ref 850–3900)
MCH: 30.3 pg (ref 27.0–33.0)
MCHC: 33.4 g/dL (ref 32.0–36.0)
MCV: 90.6 fL (ref 80.0–100.0)
MONOS PCT: 7.2 %
MPV: 10.3 fL (ref 7.5–12.5)
NEUTROS PCT: 56.3 %
Neutro Abs: 3772 cells/uL (ref 1500–7800)
PLATELETS: 274 10*3/uL (ref 140–400)
RBC: 4.13 10*6/uL (ref 3.80–5.10)
RDW: 12 % (ref 11.0–15.0)
TOTAL LYMPHOCYTE: 32.6 %
WBC: 6.7 10*3/uL (ref 3.8–10.8)
WBCMIX: 482 {cells}/uL (ref 200–950)

## 2018-03-18 LAB — TSH: TSH: 1.12 mIU/L

## 2018-03-18 LAB — C-REACTIVE PROTEIN: CRP: 2.5 mg/L (ref <8.0)

## 2018-03-18 LAB — RHEUMATOID FACTOR

## 2018-03-18 LAB — SEDIMENTATION RATE: Sed Rate: 2 mm/h (ref 0–20)

## 2018-03-19 ENCOUNTER — Other Ambulatory Visit: Payer: Self-pay | Admitting: *Deleted

## 2018-03-19 DIAGNOSIS — M2550 Pain in unspecified joint: Secondary | ICD-10-CM

## 2018-03-19 DIAGNOSIS — R768 Other specified abnormal immunological findings in serum: Secondary | ICD-10-CM

## 2018-03-19 MED ORDER — FERROUS SULFATE 325 (65 FE) MG PO TBEC: 325.0000 mg | DELAYED_RELEASE_TABLET | Freq: Every day | ORAL | 3 refills | Status: DC

## 2018-04-02 NOTE — Progress Notes (Signed)
Office Visit Note  Patient: [name]             Date of Birth: 1970-08-05           MRN: [id number]             PCP: Alycia Rossetti, MD Referring: Alycia Rossetti, MD Visit Date: 04/16/2018 Occupation: Self-employed  Subjective:  Positive ANA,and joint pain.   History of Present Illness: [name] is a 47 y.o. female seen in consultation per request of her PCP.  According to patient at age 31 she developed some knee knee joint pain which lasted for about 2 to 3 months and was treated with Naprosyn.  She had no recurrence of symptoms after that.  She states about 4 years ago she started running and developed discomfort in her trochanteric bursa for which she had injections x2.  She also started experiencing pain in her feet.  She switched to walking about 2 years ago.  She states about 3 to 4 months ago she started having pain in her bilateral hands and her arms.  She is also noticed decreased grip strength.  She continues to have left trochanteric bursa pain.  She states her father passed away 01-31-2018.  After that she started losing her hair and also noticed that her nails have become very brittle.  She was seen by her PCP who diagnosed her with anemia and placed her on iron.  She also did some lab work which came positive for ANA for that reason she was referred to me.  Patient has been experiencing increased fatigue.  She is also been under a lot of stress.  She has a heart murmur and has an appointment coming up with a cardiologist.  Activities of Daily Living:  Patient reports morning stiffness for 1 hour.   Patient Reports nocturnal pain.  Difficulty dressing/grooming: Denies Difficulty climbing stairs: Denies Difficulty getting out of chair: Denies Difficulty using hands for taps, buttons, cutlery, and/or writing: Reports  Review of Systems  Constitutional: Positive for fatigue. Negative for night sweats, weight gain and weight loss.  HENT: Negative for  mouth sores, trouble swallowing, trouble swallowing, mouth dryness and nose dryness.   Eyes: Positive for dryness. Negative for pain, redness and visual disturbance.        Lasik surgery  Respiratory: Negative for cough, shortness of breath and difficulty breathing.   Cardiovascular: Negative for chest pain, palpitations, hypertension, irregular heartbeat and swelling in legs/feet.  Gastrointestinal: Positive for constipation and diarrhea. Negative for blood in stool.        IBS  Endocrine: Negative for increased urination.  Genitourinary: Negative for vaginal dryness.  Musculoskeletal: Positive for arthralgias, joint pain, joint swelling, myalgias, morning stiffness and myalgias. Negative for muscle weakness and muscle tenderness.  Skin: Positive for hair loss. Negative for color change, rash, skin tightness, ulcers and sensitivity to sunlight.  Allergic/Immunologic: Negative for susceptible to infections.  Neurological: Positive for headaches. Negative for dizziness, memory loss, night sweats and weakness.       History of migraines  Hematological: Negative for swollen glands.  Psychiatric/Behavioral: Negative for depressed mood and sleep disturbance. The patient is not nervous/anxious.     PMFS History:  Patient Active Problem List   Diagnosis Date Noted  . History of IBS 04/16/2018  . History of migraine 04/16/2018  . Breast pain 01/31/2014  . History of abnormal mammogram 01/31/2014    Past Medical History:  Diagnosis Date  .  Asthma     Family History  Problem Relation Age of Onset  . Arthritis Mother   . Hypertension Mother   . Heart disease Father   . Arthritis Father   . Hypertension Father   . Lung cancer Brother   . Hypertension Brother   . Aneurysm Brother   . Arthritis Brother   . Asthma Brother   . Healthy Son   . Healthy Son   . Asthma Son   . Healthy Son    Past Surgical History:  Procedure Laterality Date  . ABDOMINAL HYSTERECTOMY    . BREAST BIOPSY  Left   . KNEE SURGERY Left    meniscal repair    Social History   Social History Narrative  . Not on file    Objective: Vital Signs: BP (!) 133/92 (BP Location: Right Arm, Patient Position: Sitting, Cuff Size: Normal)   Pulse 74   Resp 12   Ht 5\' 5"  (1.651 m)   Wt 154 lb 12.8 oz (70.2 kg)   BMI 25.76 kg/m    Physical Exam Vitals signs and nursing note reviewed.  Constitutional:      Appearance: She is well-developed.  HENT:     Head: Normocephalic and atraumatic.  Eyes:     Conjunctiva/sclera: Conjunctivae normal.  Neck:     Musculoskeletal: Normal range of motion.  Cardiovascular:     Rate and Rhythm: Normal rate and regular rhythm.     Heart sounds: Normal heart sounds.  Pulmonary:     Effort: Pulmonary effort is normal.     Breath sounds: Normal breath sounds.  Abdominal:     General: Bowel sounds are normal.     Palpations: Abdomen is soft.  Lymphadenopathy:     Cervical: No cervical adenopathy.  Skin:    General: Skin is warm and dry.     Capillary Refill: Capillary refill takes less than 2 seconds.  Neurological:     Mental Status: She is alert and oriented to person, place, and time.  Psychiatric:        Behavior: Behavior normal.      Musculoskeletal Exam: C-spine thoracic and lumbar spine good range of motion.  Shoulder joints elbow joints wrist joints with good range of motion.  She has some DIP and PIP thickening without any synovitis.  Hip joints were in good range of motion.  She has tenderness on palpation over left trochanteric bursa consistent with trochanteric bursitis.  Knee joints and ankle joints were in good range of motion.  She had bilateral hallux rigidus and PIP and DIP thickening consistent with osteoarthritis in her feet.  CDAI Exam: CDAI Score: Not documented Patient Global Assessment: Not documented; Provider Global Assessment: Not documented Swollen: Not documented; Tender: Not documented Joint Exam   Not documented   There is  currently no information documented on the homunculus. Go to the Rheumatology activity and complete the homunculus joint exam.  Investigation: Findings:  03/14/18: Lipid panel WNL, Ferritin 12, ANA 1:320 Nuclear, dense fine speckled, TSH 1.12, Sed rate 2, CRP 2.5, RF<14  Component     Latest Ref Rng & Units 03/14/2018  Cholesterol     <200 mg/dL 179  HDL Cholesterol     >50 mg/dL 76  Triglycerides     <150 mg/dL 72  LDL Cholesterol (Calc)     mg/dL (calc) 87  Total CHOL/HDL Ratio     <5.0 (calc) 2.4  Non-HDL Cholesterol (Calc)     <130 mg/dL (calc) 103  Iron     40 - 190 mcg/dL 54  TIBC     250 - 450 mcg/dL (calc) 278  %SAT     16 - 45 % (calc) 19  Ferritin     16 - 232 ng/mL 12 (L)  ANA Titer 1     titer 1:320 (H)  ANA Pattern 1      Nuclear, Dense Fine Speckled (A)  TSH     mIU/L 1.12  Anti Nuclear Antibody(ANA)     NEGATIVE POSITIVE (A)  Sed Rate     0 - 20 mm/h 2  CRP     <8.0 mg/L 2.5  RA Latex Turbid.     <14 IU/mL <14   Imaging: Mm 3d Screen Breast Bilateral  Result Date: 04/06/2018 CLINICAL DATA:  Screening. EXAM: DIGITAL SCREENING BILATERAL MAMMOGRAM WITH TOMO AND CAD COMPARISON:  Previous exam(s). ACR Breast Density Category c: The breast tissue is heterogeneously dense, which may obscure small masses. FINDINGS: There are no findings suspicious for malignancy. Images were processed with CAD. IMPRESSION: No mammographic evidence of malignancy. A result letter of this screening mammogram will be mailed directly to the patient. RECOMMENDATION: Screening mammogram in one year. (Code:SM-B-01Y) BI-RADS CATEGORY  1: Negative. Electronically Signed   By: Franki Cabot M.D.   On: 04/06/2018 15:57   Xr Foot 2 Views Left  Result Date: 04/16/2018 Mild PIP and DIP joint space narrowing was noted.  No MTP changes were noted.  No intertarsal or subtalar joint space narrowing was noted. Impression: These findings are consistent mild osteoarthritis of the feet.  Xr Foot  2 Views Right  Result Date: 04/16/2018 Mild PIP and DIP joint space narrowing was noted.  No MTP changes were noted.  No intertarsal or subtalar joint space narrowing was noted. Impression: These findings are consistent mild osteoarthritis of the feet.  Xr Hand 2 View Left  Result Date: 04/16/2018 Minimal PIP narrowing was noted.  No MCP, intercarpal joint space narrowing was noted. Impression: These findings are consistent with mild osteoarthritis of the hand.  Xr Hand 2 View Right  Result Date: 04/16/2018 Minimal PIP narrowing was noted.  No MCP, intercarpal joint space narrowing was noted. Impression: These findings are consistent with mild osteoarthritis of the hand.   Recent Labs: Lab Results  Component Value Date   WBC 6.7 03/14/2018   HGB 12.5 03/14/2018   PLT 274 03/14/2018   NA 138 03/14/2018   K 4.2 03/14/2018   CL 103 03/14/2018   CO2 28 03/14/2018   GLUCOSE 83 03/14/2018   BUN 9 03/14/2018   CREATININE 0.73 03/14/2018   BILITOT 0.3 03/14/2018   AST 14 03/14/2018   ALT 8 03/14/2018   PROT 6.6 03/14/2018   CALCIUM 9.5 03/14/2018    Speciality Comments: No specialty comments available.  Procedures:  No procedures performed Allergies: Amoxicillin; Erythromycin; and Prednisone   Assessment / Plan:     Visit Diagnoses: Positive ANA (antinuclear antibody) - 03/14/18: Lipid panel WNL, Ferritin 12, ANA 1:320 Nuclear, dense fine speckled, TSH 1.12, Sed rate 2, CRP 2.5, RF<14.  Patient's recent labs showed positive ANA.  She has no synovitis on examination.  She complains of increased intermittent swelling in her joints.  I will obtain AVISE labs today.  Pain in both hands -she has DIP and PIP thickening in her hands but no synovitis on examination.  Plan: XR Hand 2 View Right, XR Hand 2 View Left.  The x-rays were consistent mild osteoarthritis.  Pain  in both feet -she has DIP and PIP thickening in her feet and some changes in her bilateral first MTP consistent with  osteoarthritis.  I do not see any synovitis on examination.  Plan: XR Foot 2 Views Right, XR Foot 2 Views Left  Trochanteric bursitis of left hip-she has tenderness on palpation of her left hip joint.  She may benefit from physical therapy.  I have given her a handout on IT band exercises.  History of IBS  Heart murmur  History of migraine   Orders: Orders Placed This Encounter  Procedures  . XR Hand 2 View Right  . XR Hand 2 View Left  . XR Foot 2 Views Right  . XR Foot 2 Views Left   No orders of the defined types were placed in this encounter.   Face-to-face time spent with patient was 45 minutes. Greater than 50% of time was spent in counseling and coordination of care.  Follow-Up Instructions: Return for Positive ANA and joint pain.   Bo Merino, MD  Note - This record has been created using Editor, commissioning.  Chart creation errors have been sought, but may not always  have been located. Such creation errors do not reflect on  the standard of medical care.

## 2018-04-05 ENCOUNTER — Ambulatory Visit
Admission: RE | Admit: 2018-04-05 | Discharge: 2018-04-05 | Disposition: A | Discharge status: Home / Self Care | Payer: 59 | Source: Ambulatory Visit | Attending: Obstetrics and Gynecology | Admitting: Obstetrics and Gynecology

## 2018-04-05 DIAGNOSIS — Z1231 Encounter for screening mammogram for malignant neoplasm of breast: Secondary | ICD-10-CM

## 2018-04-16 ENCOUNTER — Encounter: Payer: Self-pay | Admitting: Rheumatology

## 2018-04-16 ENCOUNTER — Ambulatory Visit (INDEPENDENT_AMBULATORY_CARE_PROVIDER_SITE_OTHER): Payer: Self-pay

## 2018-04-16 ENCOUNTER — Ambulatory Visit: Payer: 59 | Admitting: Rheumatology

## 2018-04-16 VITALS — BP 133/92 | HR 74 | Resp 12 | Ht 65.0 in | Wt 154.8 lb

## 2018-04-16 DIAGNOSIS — M7062 Trochanteric bursitis, left hip: Secondary | ICD-10-CM

## 2018-04-16 DIAGNOSIS — M79672 Pain in left foot: Secondary | ICD-10-CM | POA: Diagnosis not present

## 2018-04-16 DIAGNOSIS — R768 Other specified abnormal immunological findings in serum: Secondary | ICD-10-CM

## 2018-04-16 DIAGNOSIS — K589 Irritable bowel syndrome without diarrhea: Secondary | ICD-10-CM | POA: Insufficient documentation

## 2018-04-16 DIAGNOSIS — M79671 Pain in right foot: Secondary | ICD-10-CM

## 2018-04-16 DIAGNOSIS — M79641 Pain in right hand: Secondary | ICD-10-CM

## 2018-04-16 DIAGNOSIS — M79642 Pain in left hand: Secondary | ICD-10-CM | POA: Diagnosis not present

## 2018-04-16 DIAGNOSIS — Z8719 Personal history of other diseases of the digestive system: Secondary | ICD-10-CM

## 2018-04-16 DIAGNOSIS — R011 Cardiac murmur, unspecified: Secondary | ICD-10-CM

## 2018-04-16 DIAGNOSIS — Z8669 Personal history of other diseases of the nervous system and sense organs: Secondary | ICD-10-CM

## 2018-04-16 DIAGNOSIS — R7689 Other specified abnormal immunological findings in serum: Secondary | ICD-10-CM

## 2018-04-16 NOTE — Patient Instructions (Signed)
Iliotibial Band Syndrome Rehab  Ask your health care provider which exercises are safe for you. Do exercises exactly as told by your health care provider and adjust them as directed. It is normal to feel mild stretching, pulling, tightness, or discomfort as you do these exercises, but you should stop right away if you feel sudden pain or your pain gets worse. Do not begin these exercises until told by your health care provider.  Stretching and range of motion exercises  These exercises warm up your muscles and joints and improve the movement and flexibility of your hip and pelvis.  Exercise A: Quadriceps, prone    1. Lie on your abdomen on a firm surface, such as a bed or padded floor.  2. Bend your left / right knee and hold your ankle. If you cannot reach your ankle or pant leg, loop a belt around your foot and grab the belt instead.  3. Gently pull your heel toward your buttocks. Your knee should not slide out to the side. You should feel a stretch in the front of your thigh and knee.  4. Hold this position for __________ seconds.  Repeat __________ times. Complete this stretch __________ times a day.  Exercise B: Iliotibial band    1. Lie on your side with your left / right leg in the top position.  2. Bend both of your knees and grab your left / right ankle. Stretch out your bottom arm to help you balance.  3. Slowly bring your top knee back so your thigh goes behind your trunk.  4. Slowly lower your top leg toward the floor until you feel a gentle stretch on the outside of your left / right hip and thigh. If you do not feel a stretch and your knee will not fall farther, place the heel of your other foot on top of your knee and pull your knee down toward the floor with your foot.  5. Hold this position for __________ seconds.  Repeat __________ times. Complete this stretch __________ times a day.  Strengthening exercises  These exercises build strength and endurance in your hip and pelvis. Endurance is the  ability to use your muscles for a long time, even after they get tired.  Exercise C: Straight leg raises (hip abductors)    1. Lie on your side with your left / right leg in the top position. Lie so your head, shoulder, knee, and hip line up. You may bend your bottom knee to help you balance.  2. Roll your hips slightly forward so your hips are stacked directly over each other and your left / right knee is facing forward.  3. Tense the muscles in your outer thigh and lift your top leg 4-6 inches (10-15 cm).  4. Hold this position for __________ seconds.  5. Slowly return to the starting position. Let your muscles relax completely before doing another repetition.  Repeat __________ times. Complete this exercise __________ times a day.  Exercise D: Straight leg raises (hip extensors)  1. Lie on your abdomen on your bed or a firm surface. You can put a pillow under your hips if that is more comfortable.  2. Bend your left / right knee so your foot is straight up in the air.  3. Squeeze your buttock muscles and lift your left / right thigh off the bed. Do not let your back arch.  4. Tense this muscle as hard as you can without increasing any knee pain.  5. Hold   this position for __________ seconds.  6. Slowly lower your leg to the starting position and allow it to relax completely.  Repeat __________ times. Complete this exercise __________ times a day.  Exercise E: Hip hike  1. Stand sideways on a bottom step. Stand on your left / right leg with your other foot unsupported next to the step. You can hold onto the railing or wall if needed for balance.  2. Keep your knees straight and your torso square. Then, lift your left / right hip up toward the ceiling.  3. Slowly let your left / right hip lower toward the floor, past the starting position. Your foot should get closer to the floor. Do not lean or bend your knees.  Repeat __________ times. Complete this exercise __________ times a day.  This information is not  intended to replace advice given to you by your health care provider. Make sure you discuss any questions you have with your health care provider.  Document Released: 04/11/2005 Document Revised: 12/15/2015 Document Reviewed: 03/13/2015  [employer] Interactive Patient Education © 2019 [employer]

## 2018-05-04 DIAGNOSIS — M19042 Primary osteoarthritis, left hand: Secondary | ICD-10-CM

## 2018-05-04 DIAGNOSIS — M19072 Primary osteoarthritis, left ankle and foot: Secondary | ICD-10-CM

## 2018-05-04 DIAGNOSIS — M19071 Primary osteoarthritis, right ankle and foot: Secondary | ICD-10-CM | POA: Insufficient documentation

## 2018-05-04 DIAGNOSIS — M19041 Primary osteoarthritis, right hand: Secondary | ICD-10-CM | POA: Insufficient documentation

## 2018-05-04 NOTE — Progress Notes (Signed)
Office Visit Note  Patient: [name]             Date of Birth: [birth year]           MRN: [id number]             PCP: Alycia Rossetti, MD Referring: Alycia Rossetti, MD Visit Date: 05/17/2018 Occupation: @GUAROCC @  Subjective:  Right arm pain.   History of Present Illness: [name] is a 48 y.o. female with history of osteoarthritis and positive ANA.  She states she has been experiencing increased neck pain, right shoulder pain and right elbow pain.  She is having difficulty gripping objects.  She continues to have discomfort in bilateral trochanteric bursa.  She is not having much discomfort in her feet currently.  Activities of Daily Living:  Patient reports morning stiffness for 5 minutes.   Patient Denies nocturnal pain.  Difficulty dressing/grooming: Denies Difficulty climbing stairs: Denies Difficulty getting out of chair: Denies Difficulty using hands for taps, buttons, cutlery, and/or writing: Denies  Review of Systems  Constitutional: Positive for fatigue. Negative for night sweats, weight gain and weight loss.  HENT: Negative for mouth sores, trouble swallowing, trouble swallowing, mouth dryness and nose dryness.   Eyes: Positive for dryness. Negative for pain, redness and visual disturbance.  Respiratory: Negative for cough, shortness of breath and difficulty breathing.   Cardiovascular: Negative for chest pain, palpitations, hypertension, irregular heartbeat and swelling in legs/feet.  Gastrointestinal: Positive for constipation and diarrhea. Negative for blood in stool.       IBS  Endocrine: Negative for increased urination.  Genitourinary: Negative for vaginal dryness.  Musculoskeletal: Positive for arthralgias, joint pain, myalgias, morning stiffness, muscle tenderness and myalgias. Negative for joint swelling and muscle weakness.  Skin: Positive for hair loss. Negative for color change, rash, skin tightness, ulcers and sensitivity to sunlight.   Allergic/Immunologic: Negative for susceptible to infections.  Neurological: Negative for dizziness, memory loss, night sweats and weakness.  Hematological: Negative for swollen glands.  Psychiatric/Behavioral: Positive for sleep disturbance. Negative for depressed mood. The patient is not nervous/anxious.     PMFS History:  Patient Active Problem List   Diagnosis Date Noted  . Primary osteoarthritis of both hands 05/04/2018  . Primary osteoarthritis of both feet 05/04/2018  . History of IBS 04/16/2018  . History of migraine 04/16/2018  . Breast pain 01/31/2014  . History of abnormal mammogram 01/31/2014    Past Medical History:  Diagnosis Date  . Asthma     Family History  Problem Relation Age of Onset  . Arthritis Mother   . Hypertension Mother   . Heart disease Father   . Arthritis Father   . Hypertension Father   . Fibromyalgia Father   . Lung cancer Brother   . Hypertension Brother   . Aneurysm Brother   . Arthritis Brother   . Asthma Brother   . Healthy Son   . Healthy Son   . Asthma Son   . Healthy Son    Past Surgical History:  Procedure Laterality Date  . ABDOMINAL HYSTERECTOMY    . BREAST BIOPSY Left   . KNEE SURGERY Left    meniscal repair    Social History   Social History Narrative  . Not on file   Immunization History  Administered Date(s) Administered  . Influenza,inj,Quad PF,6+ Mos 01/31/2014, 03/04/2016, 03/28/2017, 03/14/2018     Objective: Vital Signs: BP (!) 151/85 (BP Location: Left Arm, Patient Position: Sitting, Cuff Size:  Normal)   Pulse 67   Resp 13   Ht 5\' 5"  (1.651 m)   Wt 157 lb (71.2 kg)   BMI 26.13 kg/m    Physical Exam Vitals signs and nursing note reviewed.  Constitutional:      Appearance: She is well-developed.  HENT:     Head: Normocephalic and atraumatic.  Eyes:     Conjunctiva/sclera: Conjunctivae normal.  Neck:     Musculoskeletal: Normal range of motion.  Cardiovascular:     Rate and Rhythm: Normal  rate and regular rhythm.     Heart sounds: Normal heart sounds.  Pulmonary:     Effort: Pulmonary effort is normal.     Breath sounds: Normal breath sounds.  Abdominal:     General: Bowel sounds are normal.     Palpations: Abdomen is soft.  Lymphadenopathy:     Cervical: No cervical adenopathy.  Skin:    General: Skin is warm and dry.     Capillary Refill: Capillary refill takes less than 2 seconds.  Neurological:     Mental Status: She is alert and oriented to person, place, and time.  Psychiatric:        Behavior: Behavior normal.      Musculoskeletal Exam: C-spine thoracic lumbar spine good range of motion.  She has some discomfort range of motion of her cervical spine.  She had trapezius spasm.  She had good range of motion of her shoulder joints.  She had tenderness on over insertion of bilateral deltoid tendon.  She had tenderness over right lateral epicondyle consistent with lateral epicondylitis.  She has DIP and PIP thickening with no synovitis.  She had good range of motion of her hip joints with tenderness over bilateral trochanteric bursa.  Knee joints with good range of motion.  No warmth swelling or effusion was noted in any of her joints.  CDAI Exam: CDAI Score: Not documented Patient Global Assessment: Not documented; Provider Global Assessment: Not documented Swollen: Not documented; Tender: Not documented Joint Exam   Not documented   There is currently no information documented on the homunculus. Go to the Rheumatology activity and complete the homunculus joint exam.  Investigation: No additional findings.  Imaging: Xr Cervical Spine 2 Or 3 Views  Result Date: 05/17/2018 No disc space narrowing was noted.  Mild anterior spurring was noted.  No facet joint arthropathy was noted.   Recent Labs: Lab Results  Component Value Date   WBC 6.7 03/14/2018   HGB 12.5 03/14/2018   PLT 274 03/14/2018   NA 138 03/14/2018   K 4.2 03/14/2018   CL 103 03/14/2018    CO2 28 03/14/2018   GLUCOSE 83 03/14/2018   BUN 9 03/14/2018   CREATININE 0.73 03/14/2018   BILITOT 0.3 03/14/2018   AST 14 03/14/2018   ALT 8 03/14/2018   PROT 6.6 03/14/2018   CALCIUM 9.5 03/14/2018  April 16, 2018 AVISE index -2.4 ANA 1: 160 homogeneous, ENA negative, Jo 1-, CB CAP negative, anticardiolipin negative, beta-2 negative, RF negative, anti-CCP negative, antithyroglobulin antibody negative, anti-TPO negative  Speciality Comments: No specialty comments available.  Procedures:  No procedures performed Allergies: Amoxicillin; Erythromycin; and Prednisone   Assessment / Plan:     Visit Diagnoses: Neck pain -she is pending experiencing pain in neck and stiffness in the muscles.  Plan: XR Cervical Spine 2 or 3 views  Lateral epicondylitis, right elbow-she had tenderness on palpation over right lateral epicondyle area.  She reports decreased grip strength in her  right hand due to discomfort.  She is having difficulty lifting objects.  I will refer her to physical therapy.  Myofascial pain-she has hyperalgesia and positive tender points.  Need for regular exercise and good sleep hygiene was discussed.  Primary osteoarthritis of both hands - Mild  Trochanteric bursitis of both hips-she has severe discomfort over bilateral trochanteric bursa and having nocturnal pain.  A handout on exercises was given and she was referred to physical therapy.  Primary osteoarthritis of both feet-she is currently not having much discomfort.  Positive ANA (antinuclear antibody) - ANA 1: 160 NH,AVISE index -2.4, ENA negative, patient has no clinical features of autoimmune disease.  I had detailed discussion regarding her lab work with her.  All her autoimmune work-up is negative.  ANA is low titer which is not significant.  History of IBS-she has ongoing GI symptoms.  History of migraine -reports increased migraine due to increasing stress.  Family history of fibromyalgia in her  father.  Orders: Orders Placed This Encounter  Procedures  . XR Cervical Spine 2 or 3 views   No orders of the defined types were placed in this encounter.   Face-to-face time spent with patient was 30 minutes. Greater than 50% of time was spent in counseling and coordination of care.  Follow-Up Instructions: Return if symptoms worsen or fail to improve.   Bo Merino, MD  Note - This record has been created using Editor, commissioning.  Chart creation errors have been sought, but may not always  have been located. Such creation errors do not reflect on  the standard of medical care.

## 2018-05-17 ENCOUNTER — Ambulatory Visit (INDEPENDENT_AMBULATORY_CARE_PROVIDER_SITE_OTHER): Payer: 59

## 2018-05-17 ENCOUNTER — Encounter: Payer: Self-pay | Admitting: Rheumatology

## 2018-05-17 ENCOUNTER — Ambulatory Visit: Payer: 59 | Admitting: Rheumatology

## 2018-05-17 VITALS — BP 151/85 | HR 67 | Resp 13 | Ht 65.0 in | Wt 157.0 lb

## 2018-05-17 DIAGNOSIS — M19042 Primary osteoarthritis, left hand: Secondary | ICD-10-CM

## 2018-05-17 DIAGNOSIS — M7061 Trochanteric bursitis, right hip: Secondary | ICD-10-CM

## 2018-05-17 DIAGNOSIS — M19041 Primary osteoarthritis, right hand: Secondary | ICD-10-CM

## 2018-05-17 DIAGNOSIS — M542 Cervicalgia: Secondary | ICD-10-CM

## 2018-05-17 DIAGNOSIS — R768 Other specified abnormal immunological findings in serum: Secondary | ICD-10-CM

## 2018-05-17 DIAGNOSIS — M7711 Lateral epicondylitis, right elbow: Secondary | ICD-10-CM

## 2018-05-17 DIAGNOSIS — M7918 Myalgia, other site: Secondary | ICD-10-CM | POA: Diagnosis not present

## 2018-05-17 DIAGNOSIS — M7062 Trochanteric bursitis, left hip: Secondary | ICD-10-CM

## 2018-05-17 DIAGNOSIS — M19072 Primary osteoarthritis, left ankle and foot: Secondary | ICD-10-CM

## 2018-05-17 DIAGNOSIS — Z8669 Personal history of other diseases of the nervous system and sense organs: Secondary | ICD-10-CM

## 2018-05-17 DIAGNOSIS — Z8719 Personal history of other diseases of the digestive system: Secondary | ICD-10-CM

## 2018-05-17 DIAGNOSIS — M19071 Primary osteoarthritis, right ankle and foot: Secondary | ICD-10-CM

## 2018-05-17 NOTE — Patient Instructions (Signed)
Iliotibial Band Syndrome Rehab Ask your health care provider which exercises are safe for you. Do exercises exactly as told by your health care provider and adjust them as directed. It is normal to feel mild stretching, pulling, tightness, or discomfort as you do these exercises, but you should stop right away if you feel sudden pain or your pain gets worse.Do not begin these exercises until told by your health care provider. Stretching and range of motion exercises These exercises warm up your muscles and joints and improve the movement and flexibility of your hip and pelvis. Exercise A: Quadriceps, prone  1. Lie on your abdomen on a firm surface, such as a bed or padded floor. 2. Bend your left / right knee and hold your ankle. If you cannot reach your ankle or pant leg, loop a belt around your foot and grab the belt instead. 3. Gently pull your heel toward your buttocks. Your knee should not slide out to the side. You should feel a stretch in the front of your thigh and knee. 4. Hold this position for __________ seconds. Repeat __________ times. Complete this stretch __________ times a day. Exercise B: Iliotibial band  1. Lie on your side with your left / right leg in the top position. 2. Bend both of your knees and grab your left / right ankle. Stretch out your bottom arm to help you balance. 3. Slowly bring your top knee back so your thigh goes behind your trunk. 4. Slowly lower your top leg toward the floor until you feel a gentle stretch on the outside of your left / right hip and thigh. If you do not feel a stretch and your knee will not fall farther, place the heel of your other foot on top of your knee and pull your knee down toward the floor with your foot. 5. Hold this position for __________ seconds. Repeat __________ times. Complete this stretch __________ times a day. Strengthening exercises These exercises build strength and endurance in your hip and pelvis. Endurance is the  ability to use your muscles for a long time, even after they get tired. Exercise C: Straight leg raises (hip abductors)  1. Lie on your side with your left / right leg in the top position. Lie so your head, shoulder, knee, and hip line up. You may bend your bottom knee to help you balance. 2. Roll your hips slightly forward so your hips are stacked directly over each other and your left / right knee is facing forward. 3. Tense the muscles in your outer thigh and lift your top leg 4-6 inches (10-15 cm). 4. Hold this position for __________ seconds. 5. Slowly return to the starting position. Let your muscles relax completely before doing another repetition. Repeat __________ times. Complete this exercise __________ times a day. Exercise D: Straight leg raises (hip extensors) 1. Lie on your abdomen on your bed or a firm surface. You can put a pillow under your hips if that is more comfortable. 2. Bend your left / right knee so your foot is straight up in the air. 3. Squeeze your buttock muscles and lift your left / right thigh off the bed. Do not let your back arch. 4. Tense this muscle as hard as you can without increasing any knee pain. 5. Hold this position for __________ seconds. 6. Slowly lower your leg to the starting position and allow it to relax completely. Repeat __________ times. Complete this exercise __________ times a day. Exercise E: Hip hike  1. Stand sideways on a bottom step. Stand on your left / right leg with your other foot unsupported next to the step. You can hold onto the railing or wall if needed for balance. 2. Keep your knees straight and your torso square. Then, lift your left / right hip up toward the ceiling. 3. Slowly let your left / right hip lower toward the floor, past the starting position. Your foot should get closer to the floor. Do not lean or bend your knees. Repeat __________ times. Complete this exercise __________ times a day. This information is not  intended to replace advice given to you by your health care provider. Make sure you discuss any questions you have with your health care provider. Document Released: 04/11/2005 Document Revised: 12/15/2015 Document Reviewed: 03/13/2015 Elsevier Interactive Patient Education  2019 [employer] Elbow and Forearm Exercises Ask your health care provider which exercises are safe for you. Do exercises exactly as told by your health care provider and adjust them as directed. It is normal to feel mild stretching, pulling, tightness, or discomfort as you do these exercises, but you should stop right away if you feel sudden pain or your pain gets worse.Do not begin these exercises until told by your health care provider. Range of motion exercises These exercises warm up your muscles and joints and improve the movement and flexibility of your injured elbow and forearm. These exercises also help to relieve pain, numbness, and tingling. These exercises are done using the muscles in your injured elbow and forearm. Exercise A: Elbow flexion, active 1. Hold your left / right arm at your side, and bend your elbow as far as you can using your left / right arm muscles. 2. Hold this position for __________ seconds. 3. Slowly return to the starting position. Repeat __________ times. Complete this exercise __________ times a day. Exercise B: Elbow extension, active 1. Hold your left / right arm at your side, and straighten your elbow as much as you can using your left / right arm muscles. 2. Hold this position for __________ seconds. 3. Slowly return to the starting position. Repeat __________ times. Complete this exercise __________ times a day. Exercise C: Forearm rotation, supination, active 1. Stand or sit with your elbows at your sides. 2. Bend your left / right elbow to an "L" shape (90 degrees). 3. Turn your palm upward until you feel a gentle stretch on the inside of your forearm. 4. Hold this position for  __________ seconds. 5. Slowly release and return to the starting position. Repeat __________ times. Complete this exercise __________ times a day. Exercise D: Forearm rotation, pronation, active 1. Stand or sit with your elbows at your side. 2. Bend your left / right elbow to an "L" shape (90 degrees). 3. Turn your left / right palm downward until you feel a gentle stretch on the top of your forearm. 4. Hold this position for __________ seconds. 5. Slowly release and return to the starting position. Repeat__________ times. Complete this exercise __________ times a day. Stretching exercises These exercises warm up your muscles and joints and improve the movement and flexibility of your injured elbow and forearm. These exercises also help to relieve pain, numbness, and tingling.These exercises are done using your healthy elbow and forearm to help stretch the muscles in your injured elbow and forearm. Exercise E: Elbow flexion, active-assisted  1. Hold your left / right arm at your side, and bend your elbow as much as you can using your left /  right arm muscles. 2. Use your other hand to bend your left / right elbow farther. To do this, gently push up on your forearm until you feel a gentle stretch on the back of your elbow. 3. Hold this position for __________ seconds. 4. Slowly return to the starting position. Repeat __________ times. Complete this exercise __________ times a day. Exercise F: Elbow extension, active-assisted  1. Hold your left / right arm at your side, and straighten your elbow as much as you can using your left / right arm muscles. 2. Use your other hand to straighten the left / right elbow farther. To do this, gently push down on your forearm until you feel a gentle stretch on the inside of your elbow. 3. Hold this position for __________ seconds. 4. Slowly return to the starting position. Repeat __________ times. Complete this exercise __________ times a day. Exercise  G: Forearm rotation, supination, active-assisted  1. Sit with your left / right elbow bent in an "L" shape (90 degrees) with your forearm resting on a table. 2. Keeping your upper body and shoulder still, rotate your forearm so your left / right palm faces upward. 3. Use your other hand to help rotate your forearm further until you feel a gentle to moderate stretch. 4. Hold this position for __________ seconds. 5. Slowly release the stretch and return to the starting position. Repeat __________ times. Complete this exercise __________ times a day. Exercise H: Forearm rotation, pronation, active-assisted  1. Sit with your left / right elbow bent in an "L" shape (90 degrees) with your forearm resting on a table. 2. Keeping your upper body and shoulder still, rotate your forearm so your palm faces the tabletop. 3. Use your other hand to help rotate your forearm further until you feel a gentle to moderate stretch. 4. Hold this position for __________ seconds. 5. Slowly release the stretch and return to the starting position. Repeat __________ times. Complete this exercise __________ times a day. Exercise I: Elbow flexion, supine, passive 1. Lie on your back. 2. Extend your left / right arm up in the air, bracing it with your other hand. 3. Let your left / right your hand slowly lower toward your shoulder, while your elbow stays pointed toward the ceiling. You should feel a gentle stretch along the back of your upper arm and elbow. 4. If instructed by your health care provider, you may increase the intensity of your stretch by adding a small wrist weight or hand weight. 5. Hold this position for __________ seconds. 6. Slowly return to the starting position. Repeat __________ times. Complete this exercise __________ times a day. Exercise J: Elbow extension, supine, passive  1. Lie on your back. Make sure that you are in a comfortable position that lets you relax your arm muscles. 2. Place a  folded towel under your left / right upper arm so your elbow and shoulder are at the same height. Straighten your left / right arm so your elbow does not rest on the bed or towel. 3. Let the weight of your hand stretch your elbow. Keep your arm and chest muscles relaxed. You should feel a stretch on the inside of your elbow. 4. If told by your health care provider, you may increase the intensity of your stretch by adding a small wrist weight or hand weight. 5. Hold this position for__________ seconds. 6. Slowly release the stretch. Repeat __________ times. Complete this exercise __________ times a day. Strengthening exercises These exercises build  strength and endurance in your elbow and forearm. Endurance is the ability to use your muscles for a long time, even after they get tired. Exercise K: Elbow flexion, isometric  1. Stand or sit up straight. 2. Bend your left / right elbow in an "L" shape (90 degrees) and turn your palm up so your forearm is at the height of your waist. 3. Place your other hand on top of your forearm. Gently push down as your left / right arm resists. Push as hard as you can with both arms without causing any pain or movement at your left / right elbow. 4. Hold this position for __________ seconds. 5. Slowly release the tension in both arms. Let your muscles relax completely before repeating. Repeat __________ times. Complete this exercise __________ times a day. Exercise L: Elbow extensors, isometric  1. Stand or sit up straight. 2. Place your left / right arm so your palm faces your abdomen and it is at the height of your waist. 3. Place your other hand on the underside of your forearm. Gently push up as your left / right arm resists. Push as hard as you can with both arms, without causing any pain or movement at your left / right elbow. 4. Hold this position for __________ seconds. 5. Slowly release the tension in both arms. Let your muscles relax completely before  repeating. Repeat __________ times. Complete this exercise __________ times a day. Exercise M: Elbow flexion with forearm palm up  1. Sit upright on a firm chair without armrests, or stand. 2. Place your left / right arm at your side with your palm facing forward. 3. Holding a __________weight or gripping a rubber exercise band or tubing, bend your elbow to bring your hand toward your shoulder. 4. Hold this position for __________ seconds. 5. Slowly return to the starting position. Repeat __________times. Complete this exercise __________times a day. Exercise N: Elbow extension  1. Sit on a firm chair without armrests, or stand. 2. Keeping your upper arms at your sides, bring both hands up toward your left / right shoulder while you grip a rubber exercise band or tubing. Your left / right hand should be just below the other hand. 3. Straighten your left / right elbow. 4. Hold this position for __________ seconds. 5. Control the resistance of the band or tubing as your hand returns to your side. Repeat __________times. Complete this exercise __________times a day. Exercise O: Forearm rotation, supination  1. Sit with your left / right forearm supported on a table. Keep your elbow at waist height. 2. Rest your hand over the edge of the table with your palm facing down. 3. Gently hold a lightweight hammer. 4. Without moving your elbow, slowly rotate your forearm to turn your palm and hand upward to a "thumbs-up" position. 5. Hold this position for __________ seconds. 6. Slowly return to the starting position. Repeat __________times. Complete this exercise __________times a day. Exercise P: Forearm rotation, pronation  1. Sit with your left / right forearm supported on a table. Keep your elbow below shoulder height. 2. Rest your hand over the edge of the table with your palm facing up. 3. Gently hold a lightweight hammer. 4. Without moving your elbow, slowly rotate your forearm to turn  your palm and hand upward to a "thumbs-up" position. 5. Hold this position for __________seconds. 6. Slowly return to the starting position. Repeat __________times. Complete this exercise __________times a day. This information is not intended to replace advice  given to you by your health care provider. Make sure you discuss any questions you have with your health care provider. Document Released: 02/23/2005 Document Revised: 08/15/2017 Document Reviewed: 01/04/2015 Elsevier Interactive Patient Education  2019 [employer].

## 2018-06-04 ENCOUNTER — Ambulatory Visit: Payer: 59 | Admitting: Cardiovascular Disease

## 2018-12-29 IMAGING — CT CT ABD-PELV W/ CM
2 of 5 series · 17 of 46 positions shown, 19 images · IV contrast (ISOVUE 300)
Comparison: None.

CLINICAL DATA: Left lower quadrant pain since [place]. History of
irritable bowel syndrome.

EXAM:
CT ABDOMEN AND PELVIS WITH CONTRAST
TECHNIQUE: Multidetector CT imaging of the abdomen and pelvis was performed
using the standard protocol following bolus administration of
intravenous contrast.
CONTRAST:  100mL [id number] IOPAMIDOL (JKZQES-955) INJECTION 61%

[Series 2: axial st · axial · 0.77mm/px · z∈[-435,-65]mm · 14 of 86 slices shown, 16 images]
[im 6/86  soft-tissue]
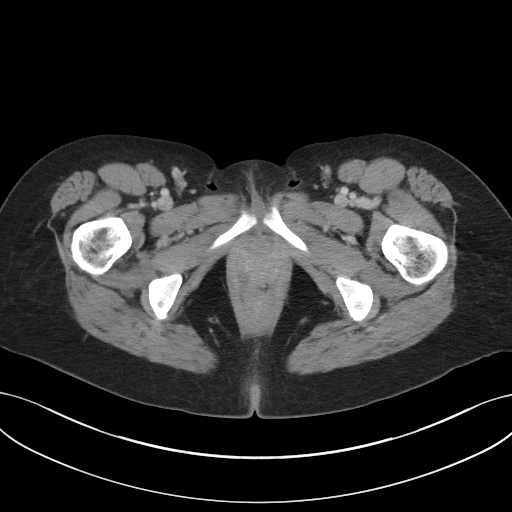
[im 6/86  bone]
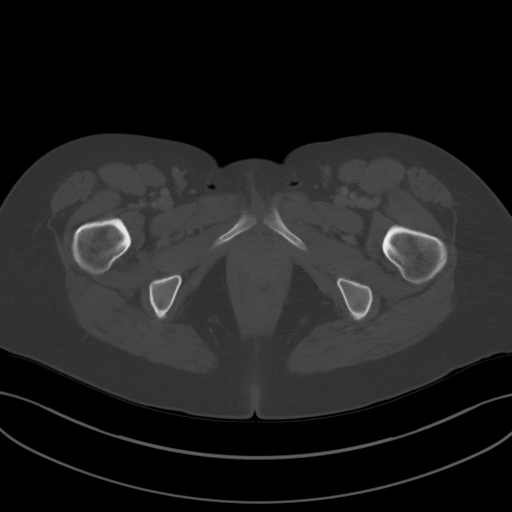
[im 11/86  soft-tissue]
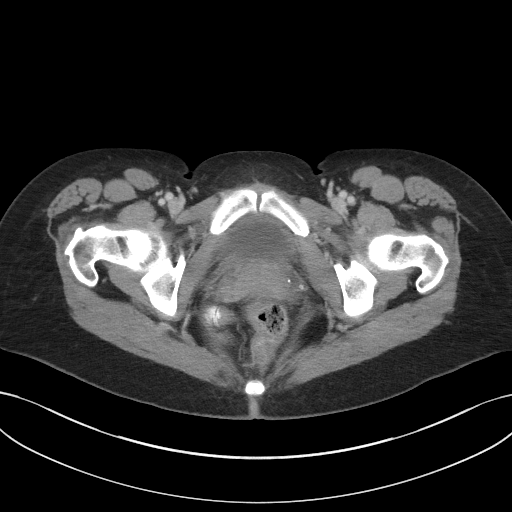
[im 16/86  soft-tissue]
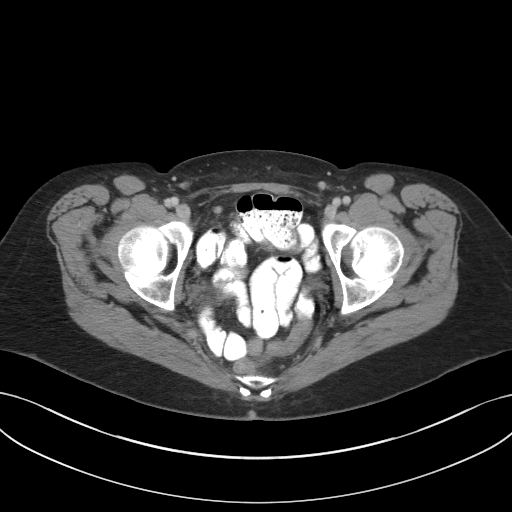
[im 22/86  soft-tissue]
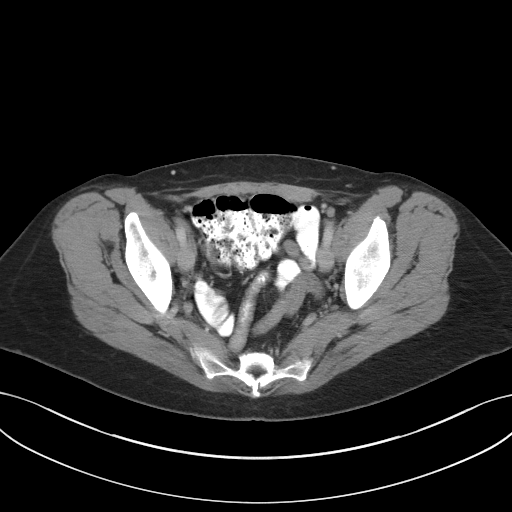
[im 27/86  soft-tissue]
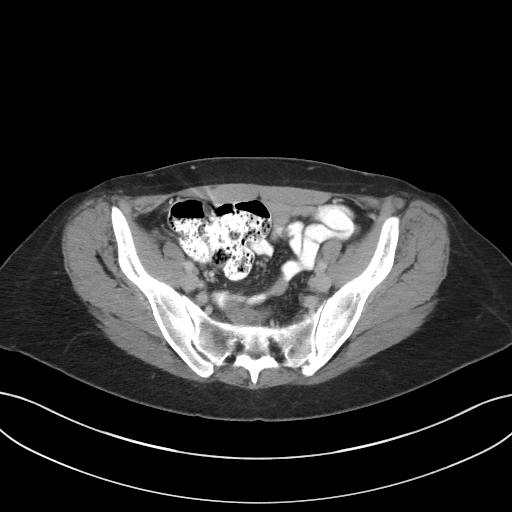
[im 32/86  soft-tissue]
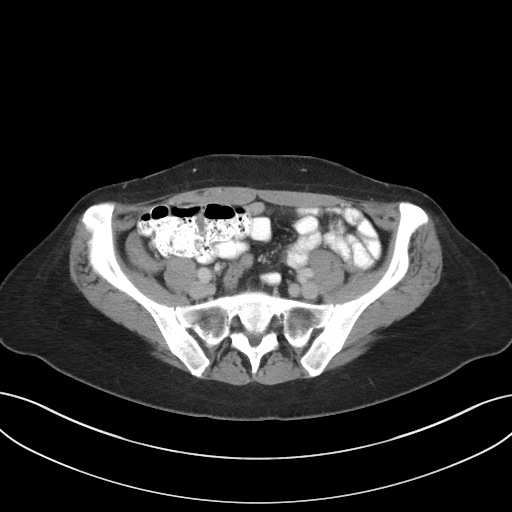
[im 38/86  soft-tissue]
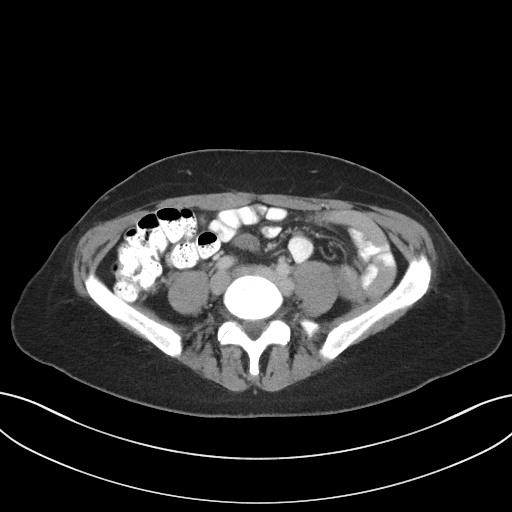
[im 48/86  soft-tissue]
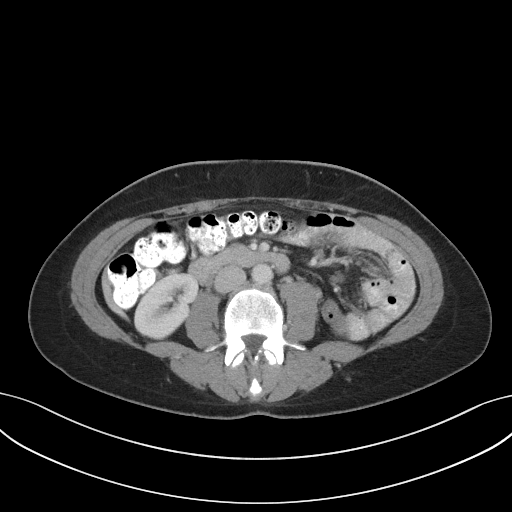
[im 54/86  soft-tissue]
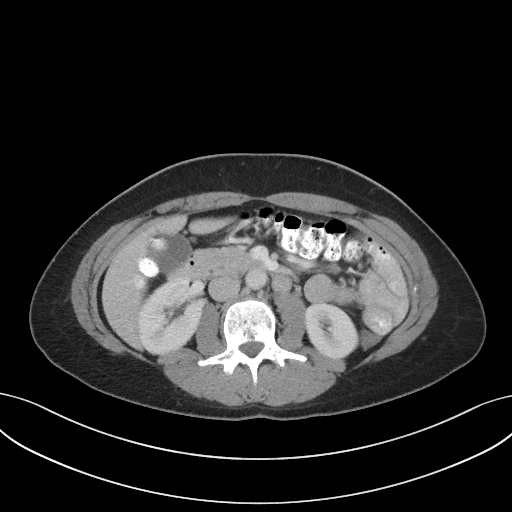
[im 54/86  bone]
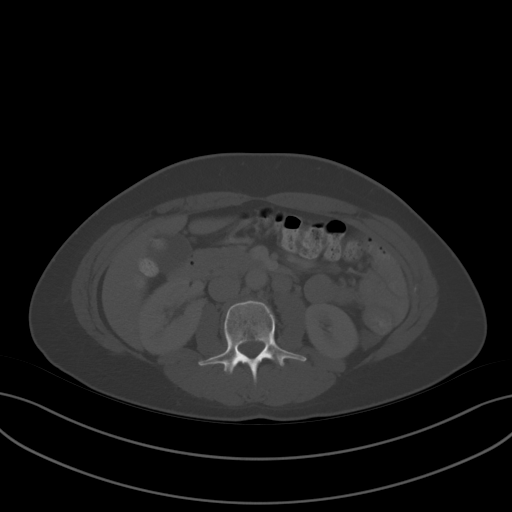
[im 59/86  soft-tissue]
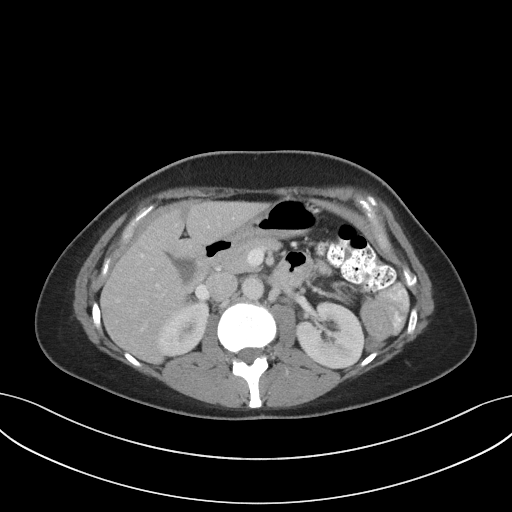
[im 64/86  soft-tissue]
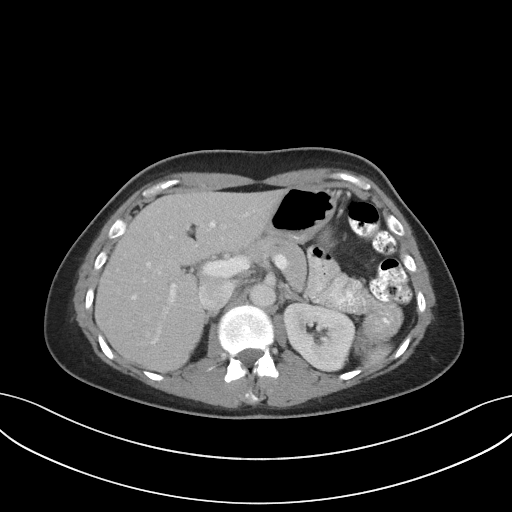
[im 70/86  soft-tissue]
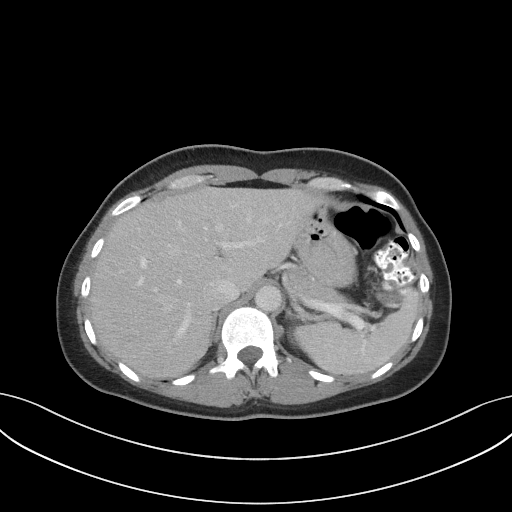
[im 75/86  soft-tissue]
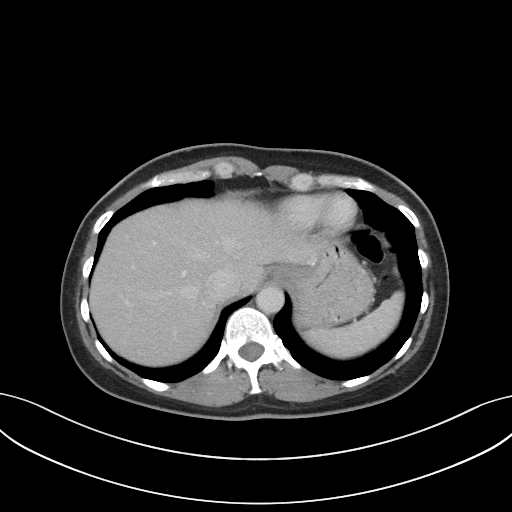
[im 80/86  soft-tissue]
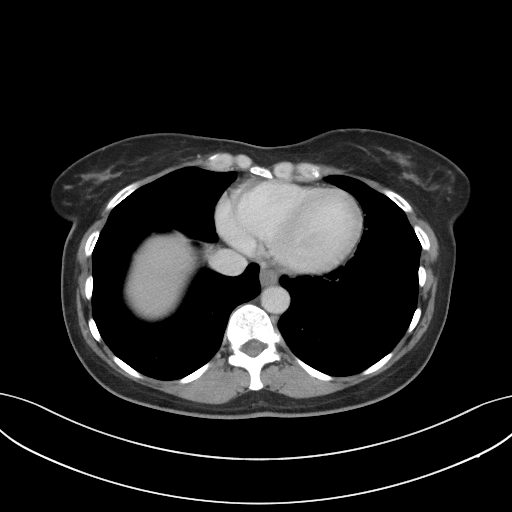

[Series 5: coronal st · coronal · 0.72mm/px · 3 of 101 slices shown]
[im 34/101  soft-tissue]
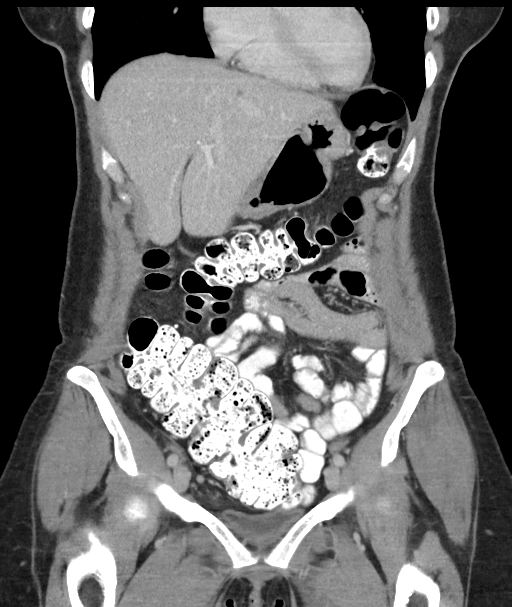
[im 45/101  soft-tissue]
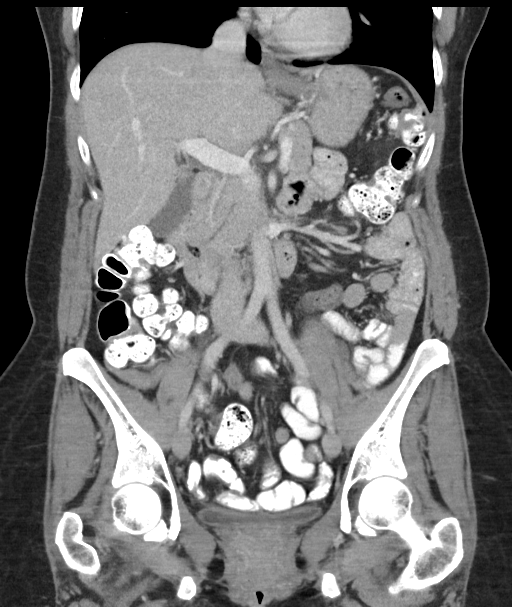
[im 56/101  soft-tissue]
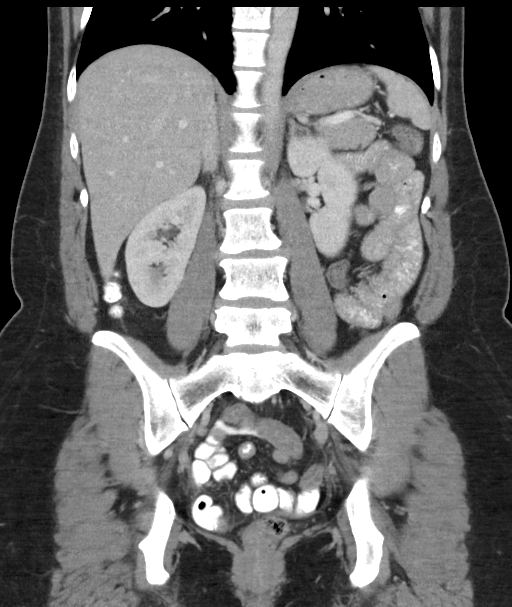

[17 of 46 positions shown; findings below may reference images not displayed]

FINDINGS: Lower chest: Normal

Hepatobiliary: Normal. No calcified gallstones. Insignificant 5 mm
low-density in the left lobe of the liver.

Pancreas: Normal

Spleen: Normal

Adrenals/Urinary Tract: Adrenal glands are normal. Kidneys are
normal except for a 1 cm cyst at the lower pole on the right and in
the midportion on the left. No bladder abnormality is seen.

Stomach/Bowel: No bowel abnormality is seen. Low lying cecum and low
lying appendix without evidence of pathologic finding. No
diverticulosis or diverticulitis. No increased amount of fecal
matter.

Vascular/Lymphatic: Normal

Reproductive: Previous hysterectomy.  No pelvic mass.

Other: No free fluid or air.

Musculoskeletal: Normal
IMPRESSION: No abnormality seen to explain the clinical presentation. No
abnormal bowel finding.

## 2019-02-04 ENCOUNTER — Telehealth: Payer: Self-pay | Admitting: Family Medicine

## 2019-02-04 NOTE — Telephone Encounter (Signed)
Needs OV first, also I will recommend her getting COVID testing, so she can go ahead and get swabbed I think [place] have been at the beach  She can use mucinex DM, allergy med OTC for the nasal drainge

## 2019-02-04 NOTE — Telephone Encounter (Signed)
Spoke with patient and informed her of your recommendations. Patient verbalized understanding and stated that she will not get COVID tested as her mom has bronchitis and tested negative last week and she feels her mom gave her what she has. Advised patient that she may discuss this with Dr. Buelah Manis on tomorrow.

## 2019-02-04 NOTE — Telephone Encounter (Signed)
Patient called in with c/o cough, chest congestion, nasal drainage and headache stating that she gets bronchitis every year at this time. Onset of symptoms Friday. Scheduled her for a telephone visit on tomorrow but patient wants to know if you are willing to go ahead and call her in something. Please advise

## 2019-02-04 NOTE — Telephone Encounter (Signed)
Noted, will discuss with pt tomorrow

## 2019-02-05 ENCOUNTER — Other Ambulatory Visit: Payer: Self-pay

## 2019-02-05 ENCOUNTER — Encounter: Payer: Self-pay | Admitting: Family Medicine

## 2019-02-05 ENCOUNTER — Ambulatory Visit (INDEPENDENT_AMBULATORY_CARE_PROVIDER_SITE_OTHER): Payer: 59 | Admitting: Family Medicine

## 2019-02-05 DIAGNOSIS — J018 Other acute sinusitis: Secondary | ICD-10-CM

## 2019-02-05 DIAGNOSIS — J45909 Unspecified asthma, uncomplicated: Secondary | ICD-10-CM

## 2019-02-05 DIAGNOSIS — J209 Acute bronchitis, unspecified: Secondary | ICD-10-CM | POA: Diagnosis not present

## 2019-02-05 MED ORDER — AZITHROMYCIN 250 MG PO TABS: ORAL_TABLET | ORAL | 0 refills | Status: DC

## 2019-02-05 MED ORDER — HYDROCOD POLST-CPM POLST ER 10-8 MG/5ML PO SUER: 5.0000 mL | Freq: Two times a day (BID) | ORAL | 0 refills | Status: DC | PRN

## 2019-02-05 MED ORDER — PREDNISONE 10 MG PO TABS: 10.0000 mg | ORAL_TABLET | Freq: Every day | ORAL | 0 refills | Status: DC

## 2019-02-05 NOTE — Progress Notes (Signed)
Virtual Visit via Telephone Note  I connected with [name] on 02/05/19 at  10:18am by telephone and verified that I am speaking with the correct person using two identifiers.      Pt location: at home   Physician location:  In office, [facility], Vic Blackbird MD     On call: patient and physician   I discussed the limitations, risks, security and privacy concerns of performing an evaluation and management service by telephone and the availability of in person appointments. I also discussed with the patient that there may be a patient responsible charge related to this service. The patient expressed understanding and agreed to proceed.   History of Present Illness:  Mother is 49y.o. she has been sick for 2 weeks, dx with bronchitis. She has been feeling bad for a week and a half.  She been helping to care for her mother.  She not had any fever or body aches butChest feels tight, has coughing fits she had blood streaked mucous in the morning from her nose and some sinus pressure.  This is often worse in the morning and the evening.  She had to use her albuterol over the weekend she has been using Mucinex.  She had a little bit of hydrocodone cough syrup at that has run out.  She denies any GI symptoms., Occasions were reviewed.  She does have underlying IBS does not tolerate prednisone very well. Observations/Objective: NAD noted of the phone she does have some cough in the background she is able to speak in full sentences  Assessment and Plan: Asthma with viral bronchitis.  I recommended that she get Covid testing but she declines doing so at this time.  States her mother was tested it was negative she has not had any other exposures.  Of note she is currently down at the beach. He has some sinusitis as well which may be triggering the asthma and bronchitis.  I will treat her with azithromycin as well as Tussionex.  I have given her prescription for low-dose  prednisone if she continues to require her albuterol inhaler she needs to start with 10 mg once a day we will see if her stomach can tolerate this.  She will also take her high Cosamin and her daily probiotic given by her GI    Follow Up Instructions:    I discussed the assessment and treatment plan with the patient. The patient was provided an opportunity to ask questions and all were answered. The patient agreed with the plan and demonstrated an understanding of the instructions.   The patient was advised to call back or seek an in-person evaluation if the symptoms worsen or if the condition fails to improve as anticipated.  I provided  10 minutes of non-face-to-face time during this encounter. End Time:10:28am  Vic Blackbird, MD

## 2019-02-27 ENCOUNTER — Other Ambulatory Visit: Payer: Self-pay | Admitting: Obstetrics and Gynecology

## 2019-02-27 DIAGNOSIS — Z1231 Encounter for screening mammogram for malignant neoplasm of breast: Secondary | ICD-10-CM

## 2019-03-01 ENCOUNTER — Other Ambulatory Visit: Payer: Self-pay

## 2019-03-01 DIAGNOSIS — Z20822 Contact with and (suspected) exposure to covid-19: Secondary | ICD-10-CM

## 2019-03-02 LAB — NOVEL CORONAVIRUS, NAA: SARS-CoV-2, NAA: NOT DETECTED

## 2019-04-22 ENCOUNTER — Ambulatory Visit
Admission: RE | Admit: 2019-04-22 | Discharge: 2019-04-22 | Disposition: A | Discharge status: Home / Self Care | Payer: 59 | Source: Ambulatory Visit | Attending: Obstetrics and Gynecology | Admitting: Obstetrics and Gynecology

## 2019-04-22 ENCOUNTER — Other Ambulatory Visit: Payer: Self-pay

## 2019-04-22 DIAGNOSIS — Z1231 Encounter for screening mammogram for malignant neoplasm of breast: Secondary | ICD-10-CM

## 2019-10-11 ENCOUNTER — Encounter: Payer: 59 | Admitting: Family Medicine

## 2019-10-14 ENCOUNTER — Other Ambulatory Visit: Payer: Self-pay

## 2019-10-14 ENCOUNTER — Encounter: Payer: Self-pay | Admitting: Family Medicine

## 2019-10-14 ENCOUNTER — Ambulatory Visit (INDEPENDENT_AMBULATORY_CARE_PROVIDER_SITE_OTHER): Payer: Self-pay | Admitting: Family Medicine

## 2019-10-14 VITALS — BP 112/62 | HR 78 | Temp 98.5°F | Resp 14 | Ht 63.0 in | Wt 157.0 lb

## 2019-10-14 DIAGNOSIS — N951 Menopausal and female climacteric states: Secondary | ICD-10-CM

## 2019-10-14 DIAGNOSIS — Z0001 Encounter for general adult medical examination with abnormal findings: Secondary | ICD-10-CM

## 2019-10-14 DIAGNOSIS — Z1159 Encounter for screening for other viral diseases: Secondary | ICD-10-CM

## 2019-10-14 DIAGNOSIS — K580 Irritable bowel syndrome with diarrhea: Secondary | ICD-10-CM

## 2019-10-14 DIAGNOSIS — Z0000 Encounter for general adult medical examination without abnormal findings: Secondary | ICD-10-CM

## 2019-10-14 NOTE — Progress Notes (Signed)
   Subjective:    Patient ID: [name], female    DOB: [birth year], 49 y.o.   MRN: [id number]  Patient presents for Annual Exam (is fasting)  Pt here for CPE  Meds and history reviewed  History of IBS- takes fiber has hyoscyamine on hand from her GI, also takes Probiotics She takes prilosec for indigestion  Due for colonoscopy- fOLLOWED by Dr. 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, history of bursitis  [name] She had tendinitis in both shoulder a few months ago- given shots in 2 both shoulders   Exercises regulary   [facility] - multiple Nevi- Rosacea  Followed by Dentist - has ppt tomorrow Eye appt - has contacts, history of lasik surgeru  Immunizations-  Declines TDAP, contemplating shingles vaccine and COVID-19    Has appt this afternoon- with GYN   she has noticed worsening menopausal symptoms, hot flashes, mood swings  Discussed Hep C/HIV screening     Review Of Systems:  GEN- denies fatigue, fever, weight loss,weakness, recent illness HEENT- denies eye drainage, change in vision, nasal discharge, CVS- denies chest pain, palpitations RESP- denies SOB, cough, wheeze ABD- denies N/V, change in stools, abd pain GU- denies dysuria, hematuria, dribbling, incontinence MSK- + joint pain, muscle aches, injury Neuro- denies headache, dizziness, syncope, seizure activity       Objective:    BP 112/62   Pulse 78   Temp 98.5 F (36.9 C) (Temporal)   Resp 14   Ht 5\' 3"  (1.6 m)   Wt 157 lb (71.2 kg)   SpO2 97%   BMI 27.81 kg/m  GEN- NAD, alert and oriented x3 HEENT- PERRL, EOMI, non injected sclera, pink conjunctiva, MMM, oropharynx clear, TM clear no effusion Neck- Supple, no thyromegaly CVS- RRR, no murmur RESP-CTAB ABD-NABS,soft,NT,ND Psych- normal affect and mood  EXT- No edema Skin in tact- multiple nevi  generalized Pulses- Radial, DP- 2+  PHQ 9 score 5  Fall/CAGE negative       Assessment & Plan:      Problem List Items Addressed This Visit      Unprioritized   IBS (irritable bowel syndrome)    discsussed COlonoscopy , pt wants to defer until the fall She did ask about cologuard, but with her bleeding with diarrhea, this would not be the best test       Other Visit Diagnoses    Routine general medical examination at a health care facility    -  Primary   CPE done, fasting labs, HIV/HEP C sreening, given handout on shingrix vaccines, contemplating COVID-19,    Relevant Orders   CBC with Differential/Platelet   Comprehensive metabolic panel   Lipid panel   TSH   HIV Antibody (routine testing w rflx)   Need for hepatitis C screening test       Relevant Orders   Hepatitis C antibody   Menopausal symptoms       Check TSH, defer to GYN for further management      Note: This dictation was prepared with Dragon dictation along with smaller phrase technology. Any transcriptional errors that result from this process are unintentional.

## 2019-10-14 NOTE — Assessment & Plan Note (Addendum)
discsussed COlonoscopy , pt wants to defer until the fall She did ask about cologuard, but with her bleeding with diarrhea, this would not be the best test

## 2019-10-14 NOTE — Patient Instructions (Signed)
F/U 1 year for physical  

## 2019-10-15 LAB — COMPREHENSIVE METABOLIC PANEL
AG Ratio: 2.5 (calc) (ref 1.0–2.5)
ALT: 14 U/L (ref 6–29)
AST: 18 U/L (ref 10–35)
Albumin: 4.9 g/dL (ref 3.6–5.1)
Alkaline phosphatase (APISO): 41 U/L (ref 37–153)
BUN: 10 mg/dL (ref 7–25)
CO2: 24 mmol/L (ref 20–32)
Calcium: 10 mg/dL (ref 8.6–10.4)
Chloride: 104 mmol/L (ref 98–110)
Creat: 0.81 mg/dL (ref 0.50–1.05)
Globulin: 2 g/dL (calc) (ref 1.9–3.7)
Glucose, Bld: 93 mg/dL (ref 65–99)
Potassium: 4.2 mmol/L (ref 3.5–5.3)
Sodium: 140 mmol/L (ref 135–146)
Total Bilirubin: 0.4 mg/dL (ref 0.2–1.2)
Total Protein: 6.9 g/dL (ref 6.1–8.1)

## 2019-10-15 LAB — CBC WITH DIFFERENTIAL/PLATELET
Absolute Monocytes: 482 cells/uL (ref 200–950)
Basophils Absolute: 49 cells/uL (ref 0–200)
Basophils Relative: 0.8 %
Eosinophils Absolute: 238 cells/uL (ref 15–500)
Eosinophils Relative: 3.9 %
HCT: 40.1 % (ref 35.0–45.0)
Hemoglobin: 13.5 g/dL (ref 11.7–15.5)
Lymphs Abs: 2086 cells/uL (ref 850–3900)
MCH: 31 pg (ref 27.0–33.0)
MCHC: 33.7 g/dL (ref 32.0–36.0)
MCV: 92 fL (ref 80.0–100.0)
MPV: 10.4 fL (ref 7.5–12.5)
Monocytes Relative: 7.9 %
Neutro Abs: 3245 cells/uL (ref 1500–7800)
Neutrophils Relative %: 53.2 %
Platelets: 262 10*3/uL (ref 140–400)
RBC: 4.36 10*6/uL (ref 3.80–5.10)
RDW: 12.1 % (ref 11.0–15.0)
Total Lymphocyte: 34.2 %
WBC: 6.1 10*3/uL (ref 3.8–10.8)

## 2019-10-15 LAB — LIPID PANEL
Cholesterol: 200 mg/dL — ABNORMAL HIGH (ref <200)
HDL: 86 mg/dL (ref >=50)
LDL Cholesterol (Calc): 101 mg/dL (calc) — ABNORMAL HIGH
Non-HDL Cholesterol (Calc): 114 mg/dL (calc) (ref <130)
Total CHOL/HDL Ratio: 2.3 (calc) (ref <5.0)
Triglycerides: 45 mg/dL (ref <150)

## 2019-10-15 LAB — HEPATITIS C ANTIBODY
Hepatitis C Ab: NONREACTIVE
SIGNAL TO CUT-OFF: 0.01 (ref <1.00)

## 2019-10-15 LAB — TSH: TSH: 1.84 mIU/L

## 2019-10-15 LAB — HIV ANTIBODY (ROUTINE TESTING W REFLEX): HIV 1&2 Ab, 4th Generation: NONREACTIVE

## 2020-03-23 ENCOUNTER — Ambulatory Visit (INDEPENDENT_AMBULATORY_CARE_PROVIDER_SITE_OTHER): Payer: 59 | Admitting: Otolaryngology

## 2020-03-23 ENCOUNTER — Encounter (INDEPENDENT_AMBULATORY_CARE_PROVIDER_SITE_OTHER): Payer: Self-pay | Admitting: Otolaryngology

## 2020-03-23 ENCOUNTER — Other Ambulatory Visit: Payer: Self-pay

## 2020-03-23 VITALS — Temp 97.7°F

## 2020-03-23 DIAGNOSIS — J3489 Other specified disorders of nose and nasal sinuses: Secondary | ICD-10-CM

## 2020-03-23 NOTE — Progress Notes (Signed)
HPI: [name] is a 49 y.o. female who presents for evaluation of of some crusting and scabbing within the nasal nostrils worse on the left side.  This began about a month and a half ago.  She has tried triple antibiotic ointment that seemed to do well.  She then tried Neosporin which seemed to make it worse.  She has been using Vaseline regularly and cleaning it with Q-tip and it seems to be doing better presently.  She is having no difficulty breathing through her nose.  Past Medical History:  Diagnosis Date  . Asthma    Past Surgical History:  Procedure Laterality Date  . ABDOMINAL HYSTERECTOMY    . BREAST BIOPSY Left   . BREAST SURGERY N/A    Phreesia 10/10/2019  . EYE SURGERY N/A    Phreesia 10/10/2019  . KNEE SURGERY Left    meniscal repair    Social History   Socioeconomic History  . Marital status: Married    Spouse name: Not on file  . Number of children: Not on file  . Years of education: Not on file  . Highest education level: Not on file  Occupational History  . Not on file  Tobacco Use  . Smoking status: Never Smoker  . Smokeless tobacco: Never Used  Vaping Use  . Vaping Use: Never used  Substance and Sexual Activity  . Alcohol use: No  . Drug use: No  . Sexual activity: Yes  Other Topics Concern  . Not on file  Social History Narrative  . Not on file   Social Determinants of Health   Financial Resource Strain:   . Difficulty of Paying Living Expenses: Not on file  Food Insecurity:   . Worried About Charity fundraiser in the Last Year: Not on file  . Ran Out of Food in the Last Year: Not on file  Transportation Needs:   . Lack of Transportation (Medical): Not on file  . Lack of Transportation (Non-Medical): Not on file  Physical Activity:   . Days of Exercise per Week: Not on file  . Minutes of Exercise per Session: Not on file  Stress:   . Feeling of Stress : Not on file  Social Connections:   . Frequency of Communication with Friends  and Family: Not on file  . Frequency of Social Gatherings with Friends and Family: Not on file  . Attends Religious Services: Not on file  . Active Member of Clubs or Organizations: Not on file  . Attends Archivist Meetings: Not on file  . Marital Status: Not on file   Family History  Problem Relation Age of Onset  . Arthritis Mother   . Hypertension Mother   . Heart disease Father   . Arthritis Father   . Hypertension Father   . Fibromyalgia Father   . Lung cancer Brother   . Hypertension Brother   . Aneurysm Brother   . Arthritis Brother   . Asthma Brother   . Healthy Son   . Healthy Son   . Asthma Son   . Healthy Son    Allergies  Allergen Reactions  . Amoxicillin     IBS  . Celebrex [Celecoxib]     Had Bleeding   . Erythromycin     IBS   . Prednisone     abd pain, HA   Prior to Admission medications   Medication Sig Start Date End Date Taking? Authorizing Provider  Calcium-Magnesium-Vitamin D (CALCIUM 1200+D3  PO) Take by mouth.   Yes [provider]  ferrous sulfate (FEROSUL) 325 (65 FE) MG tablet Take 325 mg by mouth daily with breakfast.   Yes [provider]  hyoscyamine (ANASPAZ) 0.125 MG TBDP disintergrating tablet Place 0.125 mg under the tongue.   Yes [provider]  omeprazole (PRILOSEC) 20 MG capsule Take 20 mg by mouth daily.   Yes [provider]  Probiotic Product (ALIGN) 4 MG CAPS Take by mouth.   Yes [provider]     Positive ROS: Otherwise negative  All other systems have been reviewed and were otherwise negative with the exception of those mentioned in the HPI and as above.  Physical Exam: Constitutional: Alert, well-appearing, no acute distress Ears: External ears without lesions or tenderness. Ear canals are clear bilaterally with intact, clear TMs.  Nasal: External nose without lesions. Septum deviated to the left anteriorly.  She has some mild scabbing and crusting within the  nasal vestibule slightly worse on the left side inferiorly..  Slight scabbing crusting but this is minimal.  Nasal passages otherwise clear. Oral: Lips and gums without lesions. Tongue and palate mucosa without lesions. Posterior oropharynx clear. Neck: No palpable adenopathy or masses Respiratory: Breathing comfortably  Skin: No facial/neck lesions or rash noted.  Procedures  Assessment: Vestibulitis  Plan: Recommended keeping the area clean with soap and water or hydrogen peroxide and applying mupirocin 2% ointment twice daily for 5 to 7 days and gave her a prescription. She will follow-up if symptoms worsen.  Radene Journey, MD

## 2020-03-25 ENCOUNTER — Other Ambulatory Visit: Payer: Self-pay | Admitting: Family Medicine

## 2020-03-25 DIAGNOSIS — Z1231 Encounter for screening mammogram for malignant neoplasm of breast: Secondary | ICD-10-CM

## 2020-05-15 ENCOUNTER — Ambulatory Visit: Payer: 59

## 2020-06-02 ENCOUNTER — Other Ambulatory Visit: Payer: Self-pay

## 2020-06-02 ENCOUNTER — Ambulatory Visit
Admission: RE | Admit: 2020-06-02 | Discharge: 2020-06-02 | Disposition: A | Discharge status: Home / Self Care | Payer: 59 | Source: Ambulatory Visit | Attending: Family Medicine | Admitting: Family Medicine

## 2020-06-02 DIAGNOSIS — Z1231 Encounter for screening mammogram for malignant neoplasm of breast: Secondary | ICD-10-CM

## 2020-06-03 ENCOUNTER — Other Ambulatory Visit: Payer: Self-pay | Admitting: Family Medicine

## 2020-06-03 DIAGNOSIS — N63 Unspecified lump in unspecified breast: Secondary | ICD-10-CM

## 2020-07-09 ENCOUNTER — Other Ambulatory Visit: Payer: 59

## 2020-07-20 ENCOUNTER — Other Ambulatory Visit: Payer: Self-pay

## 2020-07-20 ENCOUNTER — Ambulatory Visit: Payer: 59

## 2020-07-20 ENCOUNTER — Ambulatory Visit
Admission: RE | Admit: 2020-07-20 | Discharge: 2020-07-20 | Disposition: A | Discharge status: Home / Self Care | Payer: 59 | Source: Ambulatory Visit | Attending: Family Medicine | Admitting: Family Medicine

## 2020-07-20 DIAGNOSIS — N63 Unspecified lump in unspecified breast: Secondary | ICD-10-CM

## 2020-07-31 ENCOUNTER — Other Ambulatory Visit: Payer: Self-pay | Admitting: Physician Assistant

## 2020-07-31 DIAGNOSIS — R1013 Epigastric pain: Secondary | ICD-10-CM

## 2020-08-21 ENCOUNTER — Other Ambulatory Visit: Payer: 59

## 2020-08-24 ENCOUNTER — Other Ambulatory Visit: Payer: 59

## 2020-08-31 ENCOUNTER — Ambulatory Visit
Admission: RE | Admit: 2020-08-31 | Discharge: 2020-08-31 | Disposition: A | Discharge status: Home / Self Care | Payer: 59 | Source: Ambulatory Visit | Attending: Physician Assistant | Admitting: Physician Assistant

## 2020-08-31 DIAGNOSIS — R1013 Epigastric pain: Secondary | ICD-10-CM

## 2021-04-24 ENCOUNTER — Other Ambulatory Visit: Payer: Self-pay

## 2021-04-24 ENCOUNTER — Ambulatory Visit (HOSPITAL_COMMUNITY)
Admission: EM | Admit: 2021-04-24 | Discharge: 2021-04-24 | Disposition: A | Discharge status: Home / Self Care | Payer: 59 | Attending: Internal Medicine | Admitting: Internal Medicine

## 2021-04-24 ENCOUNTER — Encounter (HOSPITAL_COMMUNITY): Payer: Self-pay | Admitting: *Deleted

## 2021-04-24 DIAGNOSIS — R102 Pelvic and perineal pain: Secondary | ICD-10-CM | POA: Diagnosis present

## 2021-04-24 LAB — POCT URINALYSIS DIPSTICK, ED / UC
Bilirubin Urine: NEGATIVE
Glucose, UA: NEGATIVE mg/dL
Ketones, ur: NEGATIVE mg/dL
Leukocytes,Ua: NEGATIVE
Nitrite: NEGATIVE
Protein, ur: NEGATIVE mg/dL
Specific Gravity, Urine: <=1.005 (ref 1.005–1.030)
Urobilinogen, UA: 0.2 mg/dL (ref 0.0–1.0)
pH: 6.5 (ref 5.0–8.0)

## 2021-04-24 MED ORDER — KETOROLAC TROMETHAMINE 30 MG/ML IJ SOLN
INTRAMUSCULAR | Status: AC
  Filled 2021-04-24: qty 1

## 2021-04-24 MED ORDER — CIPROFLOXACIN HCL 500 MG PO TABS: 500.0000 mg | ORAL_TABLET | Freq: Two times a day (BID) | ORAL | 0 refills | Status: AC

## 2021-04-24 MED ORDER — TRAMADOL HCL 50 MG PO TABS: 50.0000 mg | ORAL_TABLET | Freq: Four times a day (QID) | ORAL | 0 refills | Status: AC | PRN

## 2021-04-24 MED ORDER — PHENAZOPYRIDINE HCL 200 MG PO TABS: 200.0000 mg | ORAL_TABLET | Freq: Three times a day (TID) | ORAL | 0 refills | Status: AC

## 2021-04-24 MED ORDER — KETOROLAC TROMETHAMINE 30 MG/ML IJ SOLN
30.0000 mg | Freq: Once | INTRAMUSCULAR | Status: AC
  Administered 2021-04-24: 30 mg via INTRAMUSCULAR

## 2021-04-24 NOTE — ED Provider Notes (Signed)
[facility]    CSN: [id number] Arrival date & time: 04/24/21  1049      History   Chief Complaint Chief Complaint  Patient presents with   Vaginal Pain    HPI [name] is a 50 y.o. female.   Patient presents with possible vaginal or bladder pain beginning 1 day ago.  Interfering with sleep.  Pain is described as severe, does not radiate, sitting centrally in the pelvic region.  Denies fever, chills, urinary, bowel changes.  Symptoms have been present intermittently for the last month.  Initially started as a viral URI which she was prescribed Augmentin for and completed course, caused yeast infection, prescribed Diflucan which cleared all symptoms.  Shortly after she had a annual vaginal exam and began having vaginal pain shortly after, treated for urinary infection twice with Macrobid.  Had a secondary vaginal exam due to continuous pain, blisters noted, started valacyclovir, per vaginal swab positive for bacterial vaginosis, currently completing course of metronidazole. History of hysterectomy, bladder repair with mesh.  Recently tested positive for influenza A.  Denies vaginal bleeding, spotting, vaginal discharge, itching, irritation, dysuria, hematuria, urinary frequency or urgency.  Endorses that she has not had sex in the last month due to recent illnesses.  Husband present at bedside.      Past Medical History:  Diagnosis Date   Asthma     Patient Active Problem List   Diagnosis Date Noted   Primary osteoarthritis of both hands 05/04/2018   Primary osteoarthritis of both feet 05/04/2018   IBS (irritable bowel syndrome) 04/16/2018   History of migraine 04/16/2018   History of abnormal mammogram 01/31/2014    Past Surgical History:  Procedure Laterality Date   ABDOMINAL HYSTERECTOMY     BREAST BIOPSY Left    BREAST SURGERY N/A    Phreesia 10/10/2019   EYE SURGERY N/A    Phreesia 10/10/2019   KNEE SURGERY Left    meniscal repair     OB  History   No obstetric history on file.      Home Medications    Prior to Admission medications   Medication Sig Start Date End Date Taking? Authorizing Provider  Calcium-Magnesium-Vitamin D (CALCIUM 1200+D3 PO) Take by mouth.    [provider]  ferrous sulfate (FEROSUL) 325 (65 FE) MG tablet Take 325 mg by mouth daily with breakfast.    [provider]  hyoscyamine (ANASPAZ) 0.125 MG TBDP disintergrating tablet Place 0.125 mg under the tongue.    [provider]  omeprazole (PRILOSEC) 20 MG capsule Take 20 mg by mouth daily.    [provider]  Probiotic Product (ALIGN) 4 MG CAPS Take by mouth.    [provider]    Family History Family History  Problem Relation Age of Onset   Arthritis Mother    Hypertension Mother    Heart disease Father    Arthritis Father    Hypertension Father    Fibromyalgia Father    Lung cancer Brother    Hypertension Brother    Aneurysm Brother    Arthritis Brother    Asthma Brother    Healthy Son    Healthy Son    Asthma Son    Healthy Son     Social History Social History   Tobacco Use   Smoking status: Never   Smokeless tobacco: Never  Vaping Use   Vaping Use: Never used  Substance Use Topics   Alcohol use: No   Drug use:  No     Allergies   Amoxicillin, Celebrex [celecoxib], Erythromycin, and Prednisone   Review of Systems Review of Systems  Constitutional: Negative.   Respiratory: Negative.    Cardiovascular: Negative.   Genitourinary:  Positive for vaginal pain. Negative for decreased urine volume, difficulty urinating, dyspareunia, dysuria, enuresis, flank pain, frequency, genital sores, hematuria, menstrual problem, pelvic pain, urgency, vaginal bleeding and vaginal discharge.  Skin: Negative.     Physical Exam Triage Vital Signs ED Triage Vitals  Enc Vitals Group     BP 04/24/21 1121 (!) 152/105     Pulse Rate 04/24/21 1121 80     Resp 04/24/21 1121 18     Temp  04/24/21 1121 98.2 F (36.8 C)     Temp src --      SpO2 04/24/21 1121 100 %     Weight --      Height --      Head Circumference --      Peak Flow --      Pain Score 04/24/21 1119 8     Pain Loc --      Pain Edu? --      Excl. in [place]? --    No data found.  Updated Vital Signs BP (!) 152/105    Pulse 80    Temp 98.2 F (36.8 C)    Resp 18    SpO2 100%   Visual Acuity Right Eye Distance:   Left Eye Distance:   Bilateral Distance:    Right Eye Near:   Left Eye Near:    Bilateral Near:     Physical Exam Constitutional:      Appearance: Normal appearance. She is normal weight.  HENT:     Head: Normocephalic.  Eyes:     Extraocular Movements: Extraocular movements intact.  Pulmonary:     Effort: Pulmonary effort is normal.  Abdominal:     General: Abdomen is flat. Bowel sounds are normal.     Palpations: Abdomen is soft.     Tenderness: There is abdominal tenderness in the suprapubic area.  Genitourinary:    Comments: Blisters present at the entrance of the vaginal canal, erythema and irritation noted along the labia minora, tenderness noted over blistering, no discharge noted no anatomical abnormalities Skin:    General: Skin is warm and dry.  Neurological:     Mental Status: She is alert. Mental status is at baseline.  Psychiatric:        Mood and Affect: Mood normal.        Behavior: Behavior normal.     UC Treatments / Results  Labs (all labs ordered are listed, but only abnormal results are displayed) Labs Reviewed - No data to display  EKG   Radiology No results found.  Procedures Procedures (including critical care time)  Medications Ordered in UC Medications - No data to display  Initial Impression / Assessment and Plan / UC Course  I have reviewed the triage vital signs and the nursing notes.  Pertinent labs & imaging results that were available during my care of the patient were reviewed by me and considered in my medical decision making  (see chart for details).  Vaginal pain Suprapubic pain  Etiology of symptoms unknown at this time, possibly related to multiple vaginal yeast/urinary infection, discussed with patient, blistering noted on vaginal exam which findings would be consistent for herpes simplex, patient had viral testing per gynecologist which was negative, currently taking valacyclovir, encourage patient to finish course  of medication, encourage patient to continue treatment for bacterial vaginosis, urinalysis negative showing only hemoglobin, sent for culture, will trial ciprofloxacin for 3-day course to cover for further bacteria, patient recently moved to [place] and is in the process of reestablishing care, recommended follow-up at local health department for further evaluation or being seen at local urgent care as needed  Coding level 4 due to length of exam, review of history Final Clinical Impressions(s) / UC Diagnoses   Final diagnoses:  None   Discharge Instructions   None    ED Prescriptions   None    PDMP not reviewed this encounter.   Hans Eden, NP 04/27/21 1606

## 2021-04-24 NOTE — Discharge Instructions (Addendum)
Your urinalysis was negative for infection, showing only microscopic blood, has been sent to the lab to see if it will grow bacteria, if this occurs you will be notified  In the meantime take ciprofloxacin twice a day for the next 3 days to cover for any bacterial infection of the urine  Continue use of metronidazole for bacterial vaginosis  Continue use of valacyclovir to help, get rid of blisters  You may use Pyridium up to 3 times a day for 2 days to help with bladder pain, attempt use of this medication first, if ineffective you may use tramadol every 6 hours as needed  When you return back to [place] you may try the local health department for further management until you find a gynecologist and primary doctor

## 2021-04-24 NOTE — ED Triage Notes (Signed)
Pt has been to GYN multiple times since 12-23 with Vaginal pain. Pt also has been taking  anti-bx  several times . Pt now reports sever bladder pain . Pt reports she is able to void.

## 2021-04-25 LAB — URINE CULTURE: Culture: <10000 — AB

## 2022-08-09 ENCOUNTER — Other Ambulatory Visit: Payer: No Typology Code available for payment source

## 2022-08-09 ENCOUNTER — Ambulatory Visit (INDEPENDENT_AMBULATORY_CARE_PROVIDER_SITE_OTHER): Payer: No Typology Code available for payment source | Admitting: Podiatry

## 2022-08-09 DIAGNOSIS — Z79899 Other long term (current) drug therapy: Secondary | ICD-10-CM

## 2022-08-09 DIAGNOSIS — B351 Tinea unguium: Secondary | ICD-10-CM

## 2022-08-09 NOTE — Progress Notes (Unsigned)
\  Subjective:  Patient ID: [name], female    DOB: Jul 20, 1970,  MRN: [id number]  Chief Complaint  Patient presents with   Nail Problem    Rm 14  Possible nail fungus x 4 months. Bilateral great toes.     52 y.o. female presents with the above complaint.  Patient presents with bilateral thickened elongated dystrophic mycotic nails x 2 to bilateral hallux.  She states that this has been present for 4 months is progressive gotten worse.  She went to get it evaluated she has a confirmed diagnosis of nail fungus.  She is already has a prescription for Lamisil she wanted to get it evaluated to make sure she can start taking it.  Her liver panel is normal.   Review of Systems: Negative except as noted in the HPI. Denies N/V/F/Ch.  Past Medical History:  Diagnosis Date   Asthma     Current Outpatient Medications:    Calcium-Magnesium-Vitamin D (CALCIUM 1200+D3 PO), Take by mouth., Disp: , Rfl:    ferrous sulfate (FEROSUL) 325 (65 FE) MG tablet, Take 325 mg by mouth daily with breakfast., Disp: , Rfl:    hyoscyamine (ANASPAZ) 0.125 MG TBDP disintergrating tablet, Place 0.125 mg under the tongue., Disp: , Rfl:    omeprazole (PRILOSEC) 20 MG capsule, Take 20 mg by mouth daily., Disp: , Rfl:    phenazopyridine (PYRIDIUM) 200 MG tablet, Take 1 tablet (200 mg total) by mouth 3 (three) times daily., Disp: 6 tablet, Rfl: 0   Probiotic Product (ALIGN) 4 MG CAPS, Take by mouth., Disp: , Rfl:    traMADol (ULTRAM) 50 MG tablet, Take 1 tablet (50 mg total) by mouth every 6 (six) hours as needed., Disp: 15 tablet, Rfl: 0  Social History   Tobacco Use  Smoking Status Never  Smokeless Tobacco Never    Allergies  Allergen Reactions   Amoxicillin     IBS   Celebrex [Celecoxib]     Had Bleeding    Erythromycin     IBS    Prednisone     abd pain, HA   Objective:  There were no vitals filed for this visit. There is no height or weight on file to calculate BMI. Constitutional Well  developed. Well nourished.  Vascular Dorsalis pedis pulses palpable bilaterally. Posterior tibial pulses palpable bilaterally. Capillary refill normal to all digits.  No cyanosis or clubbing noted. Pedal hair growth normal.  Neurologic Normal speech. Oriented to person, place, and time. Epicritic sensation to light touch grossly present bilaterally.  Dermatologic Nails thickened elongated dystrophic mycotic toenails x 2 bilateral hallux Skin within normal limits  Orthopedic: Normal joint ROM without pain or crepitus bilaterally. No visible deformities. No bony tenderness.   Radiographs: None Assessment:   1. Long-term use of high-risk medication   2. Nail fungus   3. Onychomycosis due to dermatophyte    Plan:  Patient was evaluated and treated and all questions answered.  Bilateral hallux onychomycosis -Educated the patient on the etiology of onychomycosis and various treatment options associated with improving the fungal load.  I explained to the patient that there is 3 treatment options available to treat the onychomycosis including topical, p.o., laser treatment.  Patient elected to undergo p.o. options with Lamisil/terbinafine therapy.  In order for me to start the medication therapy, I explained to the patient the importance of evaluating the liver and obtaining the liver function test.  Once the liver function test comes back normal I will start him on  8-month course of Lamisil therapy.  Patient understood all risk and would like to proceed with Lamisil therapy.  I have asked the patient to immediately stop the Lamisil therapy if she has any reactions to it and call the office or go to the emergency room right away.  Patient states understanding -Patient already has received Lamisil prescription as well as liver function test which was done all within normal limits she can start taking the medication  No follow-ups on file.

## 2022-12-09 ENCOUNTER — Ambulatory Visit: Payer: No Typology Code available for payment source | Admitting: Podiatry
